# Patient Record
Sex: Female | Born: 1940 | Race: White | Hispanic: No | Marital: Married | Smoking: Current every day smoker
Health system: Southern US, Community
[De-identification: ages and names within clinical notes are randomized; demographics above are authoritative.]

## PROBLEM LIST (undated history)

## (undated) DIAGNOSIS — I639 Cerebral infarction, unspecified: Secondary | ICD-10-CM

## (undated) DIAGNOSIS — M1990 Unspecified osteoarthritis, unspecified site: Secondary | ICD-10-CM

## (undated) DIAGNOSIS — F32A Depression, unspecified: Secondary | ICD-10-CM

## (undated) DIAGNOSIS — I10 Essential (primary) hypertension: Secondary | ICD-10-CM

## (undated) DIAGNOSIS — G459 Transient cerebral ischemic attack, unspecified: Secondary | ICD-10-CM

## (undated) DIAGNOSIS — F319 Bipolar disorder, unspecified: Secondary | ICD-10-CM

## (undated) DIAGNOSIS — I2510 Atherosclerotic heart disease of native coronary artery without angina pectoris: Secondary | ICD-10-CM

## (undated) DIAGNOSIS — I219 Acute myocardial infarction, unspecified: Secondary | ICD-10-CM

## (undated) DIAGNOSIS — M545 Low back pain, unspecified: Secondary | ICD-10-CM

## (undated) DIAGNOSIS — E785 Hyperlipidemia, unspecified: Secondary | ICD-10-CM

## (undated) DIAGNOSIS — N281 Cyst of kidney, acquired: Secondary | ICD-10-CM

## (undated) DIAGNOSIS — F29 Unspecified psychosis not due to a substance or known physiological condition: Secondary | ICD-10-CM

## (undated) DIAGNOSIS — R0602 Shortness of breath: Secondary | ICD-10-CM

## (undated) DIAGNOSIS — F329 Major depressive disorder, single episode, unspecified: Secondary | ICD-10-CM

## (undated) HISTORY — PX: BLEPHAROPLASTY: SUR158

## (undated) HISTORY — PX: CORONARY ARTERY BYPASS GRAFT: SHX141

## (undated) HISTORY — PX: SUBCLAVIAN STENT PLACEMENT: SUR1038

## (undated) HISTORY — PX: OTHER SURGICAL HISTORY: SHX169

## (undated) HISTORY — PX: ABDOMINAL HYSTERECTOMY: SHX81

## (undated) HISTORY — PX: CARDIAC SURGERY: SHX584

---

## 2004-10-28 ENCOUNTER — Ambulatory Visit: Payer: Self-pay | Admitting: Internal Medicine

## 2006-03-03 ENCOUNTER — Other Ambulatory Visit: Payer: Self-pay

## 2006-03-03 ENCOUNTER — Ambulatory Visit: Payer: Self-pay | Admitting: Internal Medicine

## 2006-03-21 ENCOUNTER — Inpatient Hospital Stay (HOSPITAL_COMMUNITY): Admission: EM | Admit: 2006-03-21 | Discharge: 2006-03-22 | Payer: Self-pay | Admitting: Emergency Medicine

## 2006-05-24 ENCOUNTER — Encounter: Payer: Self-pay | Admitting: Internal Medicine

## 2006-06-11 ENCOUNTER — Encounter: Payer: Self-pay | Admitting: Internal Medicine

## 2006-07-11 ENCOUNTER — Encounter: Payer: Self-pay | Admitting: Internal Medicine

## 2006-08-11 ENCOUNTER — Encounter: Payer: Self-pay | Admitting: Internal Medicine

## 2006-08-21 ENCOUNTER — Other Ambulatory Visit: Payer: Self-pay

## 2006-08-21 ENCOUNTER — Emergency Department: Payer: Self-pay | Admitting: Unknown Physician Specialty

## 2006-09-11 ENCOUNTER — Encounter: Payer: Self-pay | Admitting: Internal Medicine

## 2007-02-14 ENCOUNTER — Ambulatory Visit: Payer: Self-pay | Admitting: Internal Medicine

## 2007-03-06 ENCOUNTER — Ambulatory Visit: Payer: Self-pay | Admitting: Psychiatry

## 2008-02-25 ENCOUNTER — Inpatient Hospital Stay: Payer: Self-pay | Admitting: Internal Medicine

## 2011-08-26 ENCOUNTER — Inpatient Hospital Stay: Payer: Self-pay | Admitting: Internal Medicine

## 2011-08-26 LAB — URINALYSIS, COMPLETE
Bacteria: NONE SEEN
Bilirubin,UR: NEGATIVE
Ketone: NEGATIVE
Ph: 6 (ref 4.5–8.0)
RBC,UR: 2 /HPF (ref 0–5)
Squamous Epithelial: 3
WBC UR: <1 /HPF (ref 0–5)

## 2011-08-26 LAB — CBC
HCT: 46.2 % (ref 35.0–47.0)
HGB: 15.6 g/dL (ref 12.0–16.0)
MCH: 30.5 pg (ref 26.0–34.0)
MCHC: 33.9 g/dL (ref 32.0–36.0)
RDW: 15 % — ABNORMAL HIGH (ref 11.5–14.5)

## 2011-08-26 LAB — CK TOTAL AND CKMB (NOT AT ARMC): CK-MB: 0.9 ng/mL (ref 0.5–3.6)

## 2011-08-26 LAB — COMPREHENSIVE METABOLIC PANEL
Alkaline Phosphatase: 99 U/L (ref 50–136)
Calcium, Total: 9.1 mg/dL (ref 8.5–10.1)
Creatinine: 0.89 mg/dL (ref 0.60–1.30)
EGFR (African American): >60
EGFR (Non-African Amer.): >60
Glucose: 120 mg/dL — ABNORMAL HIGH (ref 65–99)
Osmolality: 283 (ref 275–301)
SGPT (ALT): 28 U/L (ref 12–78)
Sodium: 141 mmol/L (ref 136–145)

## 2011-08-26 LAB — PROTIME-INR: Prothrombin Time: 13.8 secs (ref 11.5–14.7)

## 2011-08-27 LAB — BASIC METABOLIC PANEL
Anion Gap: 10 (ref 7–16)
Calcium, Total: 8.7 mg/dL (ref 8.5–10.1)
Co2: 25 mmol/L (ref 21–32)
Creatinine: 0.74 mg/dL (ref 0.60–1.30)
EGFR (African American): >60
EGFR (Non-African Amer.): >60
Glucose: 94 mg/dL (ref 65–99)
Sodium: 143 mmol/L (ref 136–145)

## 2011-08-27 LAB — CBC WITH DIFFERENTIAL/PLATELET
Basophil #: 0.1 10*3/uL (ref 0.0–0.1)
Eosinophil #: 0.2 10*3/uL (ref 0.0–0.7)
HGB: 13.6 g/dL (ref 12.0–16.0)
Lymphocyte %: 36.5 %
MCHC: 33.2 g/dL (ref 32.0–36.0)
Monocyte %: 11 %
Neutrophil %: 48.9 %
Platelet: 154 10*3/uL (ref 150–440)
RDW: 15.1 % — ABNORMAL HIGH (ref 11.5–14.5)

## 2011-08-27 LAB — LIPID PANEL
Ldl Cholesterol, Calc: 80 mg/dL (ref 0–100)
VLDL Cholesterol, Calc: 29 mg/dL (ref 5–40)

## 2011-08-28 LAB — PROTIME-INR: Prothrombin Time: 13.1 secs (ref 11.5–14.7)

## 2011-10-06 ENCOUNTER — Ambulatory Visit: Payer: Self-pay | Admitting: Vascular Surgery

## 2011-10-06 LAB — CREATININE, SERUM
EGFR (African American): >60
EGFR (Non-African Amer.): >60

## 2013-10-06 DIAGNOSIS — I2510 Atherosclerotic heart disease of native coronary artery without angina pectoris: Secondary | ICD-10-CM | POA: Insufficient documentation

## 2013-10-06 DIAGNOSIS — I679 Cerebrovascular disease, unspecified: Secondary | ICD-10-CM | POA: Insufficient documentation

## 2013-10-06 DIAGNOSIS — J449 Chronic obstructive pulmonary disease, unspecified: Secondary | ICD-10-CM | POA: Insufficient documentation

## 2013-10-06 DIAGNOSIS — F324 Major depressive disorder, single episode, in partial remission: Secondary | ICD-10-CM | POA: Insufficient documentation

## 2013-10-10 ENCOUNTER — Emergency Department: Payer: Self-pay | Admitting: Emergency Medicine

## 2013-10-22 DIAGNOSIS — G458 Other transient cerebral ischemic attacks and related syndromes: Secondary | ICD-10-CM | POA: Insufficient documentation

## 2014-04-29 NOTE — H&P (Signed)
PATIENT NAME:  Victoria Mccarty, POLCYN MR#:  [id number] DATE OF BIRTH:  15-Oct-1940  DATE OF ADMISSION:  08/26/2011  REFERRING PHYSICIAN: Dr. Mindi Junker.  FAMILY PHYSICIAN: Dr. Einar Crow.   REASON FOR ADMISSION: Stuttering expressive aphasia associated with dizziness and headache worrisome for transient ischemic attack.   HISTORY OF PRESENT ILLNESS: The patient is a 74 year old female with a history of coronary artery disease status post coronary artery bypass graft five years ago as well as hyperlipidemia. She continues to smoke. She presents to the Emergency Room with a three day history of intermittent expressive aphasia associated with dizziness, headache, and difficulty reading. In the Emergency Room this morning, the patient's symptoms improved spontaneously. Initial head CT was negative. She was not hypertensive. She is now admitted for further evaluation.   PAST MEDICAL HISTORY:  1. Atherosclerotic cardiovascular disease status post coronary artery bypass grafting.  2. Benign hypertension.  3. Hyperlipidemia.  4. Chronic obstructive pulmonary disease/tobacco abuse.  5. History of depression.  6. Status post hysterectomy.   MEDICATIONS:  1. Seroquel 100 mg p.o. at bedtime.  2. Coreg 6.25 mg p.o. b.i.d.  3. Pravastatin 20 mg p.o. at bedtime.   ALLERGIES: No known drug allergies.   SOCIAL HISTORY: The patient smokes 1 pack per day. No history of alcohol abuse.   FAMILY HISTORY: Positive for coronary artery disease, hypertension, stroke, and hyperlipidemia.   REVIEW OF SYSTEMS: CONSTITUTIONAL: No fever or change in weight. EYES: Has had visual disturbance. No glaucoma. ENT: No tinnitus or hearing loss. No nasal discharge or bleeding. No difficulty swallowing. RESPIRATORY: No cough or wheezing. Denies hemoptysis. CARDIOVASCULAR: No chest pain or orthopnea. No palpitations or syncope. GASTROINTESTINAL: No nausea, vomiting, or diarrhea. No abdominal pain. No change in bowel habits. GU: No  dysuria or hematuria. No incontinence. ENDOCRINE: No polyuria or polydipsia. No heat or cold intolerance. HEMATOLOGIC: The patient denies anemia, easy bruising, or bleeding. LYMPHATIC: No swollen glands. MUSCULOSKELETAL: The patient denies pain in her neck, back, shoulders, knees, or hips. No gout. NEUROLOGIC: No numbness. Denies migraines. Denies seizures. No previous stroke. PSYCH: The patient denies anxiety, insomnia, or depression.   PHYSICAL EXAMINATION:  GENERAL: The patient is in no acute distress.   VITAL SIGNS: Vital signs are currently remarkable for blood pressure 104/80 with a heart rate of 73 and a respiratory rate of 18. She is afebrile.   HEENT: Normocephalic, atraumatic. Pupils equally round and reactive to light and accommodation. Extraocular movements are intact. Sclerae anicteric. Conjunctivae are clear. Oropharynx is clear.   NECK: Supple without jugular venous distention or bruits. No adenopathy or thyromegaly is noted.   LUNGS: Clear to auscultation and percussion without wheezes, rales, or rhonchi. No dullness.   CARDIAC: Regular rate and rhythm with normal S1 and S2. No significant rubs, murmurs, or gallops. PMI is nondisplaced. Chest wall is nontender.   ABDOMEN: Soft, nontender, with normoactive bowel sounds. No organomegaly or masses were appreciated. No hernias or bruits were noted.   EXTREMITIES: Without clubbing, cyanosis, or edema. Pulses were 2+ bilaterally.   SKIN: Warm and dry without rash or lesions.   NEUROLOGIC: Cranial nerves II through XII grossly intact. Deep tendon reflexes were symmetric. Motor and sensory examination is nonfocal.   PSYCH: The patient was alert and oriented to person, place, and time. She was cooperative and used good judgment.   LABORATORY, RADIOLOGICAL AND DIAGNOSTIC DATA: Glucose was 120 with a BUN of 13 and a creatinine of 0.89 with a sodium of 141. White count was  8.6 with a hemoglobin of 15.6. Head CT revealed mild atrophy with  chronic small vessel ischemic changes.   ASSESSMENT:  1. Intermittent expressive aphasia with other associated symptoms concerning for recurrent transient ischemic attacks.  2. Atherosclerotic cardiovascular disease.  3. Chronic obstructive pulmonary disease/tobacco abuse.  4. Benign hypertension.  5. Hyperlipidemia.  6. Hyperglycemia.   PLAN:  1. The patient will be admitted to the floor with Lovenox and Plavix.  2. We will monitor her blood pressure closely and check on her lipid status.  3. Will obtain MRI of the brain as well as carotid Doppler's and an echocardiogram because of her transient ischemic attack like symptoms.  4. We will use a nicotine patch in the hospital and encourage the patient to stop smoking.  5. We will follow her sugars with Accu-Cheks before meals and at bedtime and add sliding scale insulin if needed.  6. Neuro checks q.4 hours.  7. Speech therapy consult.  8. Further treatment and evaluation will depend upon the patient's progress.   TOTAL TIME SPENT ON THIS PATIENT: 45 minutes.  ____________________________ Duane LopeJeffrey D. Judithann SheenSparks, MD jds:ap D: 08/26/2011 14:08:53 ET              T: 08/26/2011 14:27:35 ET JOB#: [id number] cc: Duane LopeJeffrey D. Judithann SheenSparks, MD, <Dictator> Marya AmslerMarshall W. Dareen PianoAnderson, MD Armstead Heiland Rodena Medin Phenix Grein MD ELECTRONICALLY SIGNED 08/26/2011 16:49

## 2014-04-29 NOTE — Op Note (Signed)
PATIENT NAME:  Victoria Mccarty, Victoria Mccarty MR#:  [id number] DATE OF BIRTH:  May 24, 1940  DATE OF PROCEDURE:  10/06/2011  PREOPERATIVE DIAGNOSES:  1. Left subclavian artery stenosis.  2. 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  3. Coronary disease.  4. Hypertension.   POSTOPERATIVE DIAGNOSES: 1. Left subclavian artery stenosis.  2. 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  3. Coronary disease.  4. Hypertension.   PROCEDURES:  1. Ultrasound guidance for vascular access, right femoral artery.  2. Catheter placement into left brachial artery from right femoral approach.  3. Thoracic aortogram and selective left subclavian and upper extremity angiogram.  4. Percutaneous transluminal angioplasty of proximal left subclavian artery stenosis with 7 mm diameter angioplasty balloon.  5. 7 mm diameter x 18 mm length balloon expandable stent placement for greater than 50% residual stenosis after angioplasty of the left subclavian artery.  6. StarClose closure device, right femoral artery.   SURGEON: Annice NeedyJason S. Dew, MD   ANESTHESIA: Local with moderate conscious sedation.   ESTIMATED BLOOD LOSS: Approximately 25 mL.  FLUOROSCOPY TIME: Approximately 6.8 minutes.   CONTRAST USED: 70 mL.    INDICATION FOR PROCEDURE: This is a 74 year old female who was referred to me from her neurologist's office after a posterior circulation stroke. She had a left subclavian stenosis on that side and it was felt that this was likely a causative factor. She also has a history of coronary disease with coronary bypass grafting. It is unclear whether or not she had a LIMA graft based off this left subclavian or if this was done as a free graft. Due to the neurologic symptoms, angiography with possible revascularization is indicated. Risks and benefits were discussed. Informed consent was obtained.   DESCRIPTION OF PROCEDURE: The patient is brought to the Vascular Interventional Radiology Suite. Groins were  shaved and prepped and a sterile surgical field was created. The right femoral head was localized with fluoroscopy and the right femoral artery was accessed without difficulty under direct ultrasound guidance with a Seldinger needle and permanent image was recorded. A 5 French sheath was placed. The patient was systemically heparinized with a total of 4500 units of intravenous heparin. Initially a pigtail catheter was placed in the ascending aorta and an LAO projection thoracic aortogram was performed. This showed a bovine configuration of the innominate and left common carotid artery off what appeared to be a common trunk. The left subclavian artery was initially not particularly well seen and the flow was much more sluggish through the left subclavian artery than through the other vessels. A second image was performed that did show the origin and there appeared to be a significant stenosis just beyond the origin. A JB-2 catheter was used to selectively cannulate this origin and imaging was performed. This demonstrated what appeared to be a stenosis in the 80 to 85% range 1 cm beyond the origin left subclavian terminating about 5 mm shy of the vertebral artery. I did not see an internal mammary artery coming off of the left subclavian or axillary locations. I was able to cross the lesion with a stiff angled Glidewire with a mild amount of difficulty and advanced the catheter beyond the lesion. Once I did this, I exchanged for a stiff wire and put a 6 [place]French shuttle sheath into the left subclavian artery. I had taken a catheter out to the brachial artery to exchange for the stiff wire. A 7 mm diameter balloon was then inflated in the proximal subclavian artery encompassing the  lesion. A waste was taken. Completion angiogram following angioplasty showed a significant residual stenosis of greater than 50% after angioplasty and for this reason I elected to place a stent. We did not have any 0.035 stents, only long  working shaft to work through the shuttle sheath, so I again placed a catheter out into the brachial artery and exchanged for a V-18 wire removing the diagnostic catheter. Over the 0.018 wire we used a Herculink 7 mm diameter x 18 mm length balloon expandable stent, deployed this precisely encompassing the lesion terminating just before the vertebral takeoff and originating into the subclavian artery not sticking into the aorta. The lesion was well taken. We took this to burst inflation which was approximately 7.2 mm in diameter and completion angiogram following this showed excellent flow through the stent with no significant residual stenosis. Antegrade flow in the left vertebral artery was now present. I still did not see any form of IMA bypass coming off of the left side so I would assume that this was either done as a free LIMA graft or as a right mammary graft to her coronary or that this has been chronically occluded due to the low flow. At this point I elected to terminate the procedure. The sheath was pulled back to the ipsilateral external iliac artery and oblique arteriogram was performed. StarClose closure device was deployed in the usual fashion with excellent hemostatic result. The patient tolerated the procedure well and was taken to the recovery room in stable condition.   ____________________________ Annice Needy, MD jsd:drc D: 10/06/2011 11:03:28 ET T: 10/06/2011 11:52:48 ET JOB#: [id number]  cc: Annice Needy, MD, <Dictator> Marya Amsler. Dareen Piano, MD Paulita Fujita Malvin Johns, MD Annice Needy MD ELECTRONICALLY SIGNED 10/06/2011 13:45

## 2014-04-29 NOTE — Discharge Summary (Signed)
PATIENT NAME:  Victoria Mccarty, Victoria Mccarty MR#:  [id number] DATE OF BIRTH:  11/09/1940  DATE OF ADMISSION:  08/26/2011 DATE OF DISCHARGE:  08/29/2011  DISCHARGE DIAGNOSES:  1. Stroke, multiple.  2. Hypertension, accelerated with the stroke.  3. Cardiomyopathy with ejection fraction 35%.  4. Possible subclavian stenosis.  5. Long history of tobacco abuse now trying to discontinue.  6. Hypercholesterolemia, on pravastatin.  7. History of depression, doing well on Seroquel as an outpatient.   DISCHARGE MEDICATIONS:  1. Carvedilol 12.5 mg twice a day. 2. Losartan 100 mg daily.  3. Xarelto 20 mg daily for at least a month. Further time frame to be decided on follow-up with neurology. 4. Seroquel XR 300 mg nightly as she had been doing previously as an outpatient.   HISTORY AND PHYSICAL: Please see detailed history and physical done on admission.   HOSPITAL COURSE: The patient was admitted with expressive aphasia and found to have two left-sided strokes, in the corona radiata and the left parietal lobe. Her aphasia improved, or is partial at this point, speaking relatively well morning, although it does change in intensity over time. Echocardiogram showed moderate global hypokinesis with an ejection fraction 30% on echocardiogram. She had no carotid stenosis on the carotid ultrasound, but did have bidirectional flow in the left vertebral artery which could mean she had a possible stenotic lesion in the proximal left subclavian. Her labs were otherwise unremarkable with normal CBC, MET-B throughout with some intermittent hyperglycemia that was mild and may not reflect true fasting levels despite that. Her LDL was 80 on the pravastatin, which is appropriate. She was somewhat motivated to stop smoking and had a nicotine patch here, although it is unclear if that will last as an outpatient. She has an electronic cigarette which she will use going forward, per  her plan. We will try to arrange early follow-up with  neurology to help decide Xarelto dosing and length of time as well as any further work-up needed for the subclavian stenosis. We will see her in my office as well to judge her blood pressure further away from the stroke. We will try to get speech therapy outpatient evaluation and help and follow-up as well. ____________________________ Ocie Cornfield. Ouida Sills, MD mwa:slb D: 08/29/2011 07:43:16 ET T: 08/29/2011 12:45:20 ET JOB#: [id number]  cc: Ocie Cornfield. Ouida Sills, MD, <Dictator> Kirk Ruths MD ELECTRONICALLY SIGNED 08/30/2011 7:34

## 2014-08-06 ENCOUNTER — Inpatient Hospital Stay
Admit: 2014-08-06 | Discharge: 2014-08-06 | Disposition: A | Discharge status: Home / Self Care | Payer: Medicare Other | Attending: Internal Medicine | Admitting: Internal Medicine

## 2014-08-06 ENCOUNTER — Encounter: Payer: Self-pay | Admitting: Emergency Medicine

## 2014-08-06 ENCOUNTER — Other Ambulatory Visit: Payer: Self-pay | Admitting: Physician Assistant

## 2014-08-06 ENCOUNTER — Emergency Department: Payer: Medicare Other

## 2014-08-06 ENCOUNTER — Inpatient Hospital Stay
Admission: EM | Admit: 2014-08-06 | Discharge: 2014-08-08 | DRG: 066 | Disposition: A | Discharge status: Home / Self Care | Payer: Medicare Other | Attending: Internal Medicine | Admitting: Internal Medicine

## 2014-08-06 DIAGNOSIS — Z8673 Personal history of transient ischemic attack (TIA), and cerebral infarction without residual deficits: Secondary | ICD-10-CM

## 2014-08-06 DIAGNOSIS — N281 Cyst of kidney, acquired: Secondary | ICD-10-CM | POA: Diagnosis present

## 2014-08-06 DIAGNOSIS — Z7902 Long term (current) use of antithrombotics/antiplatelets: Secondary | ICD-10-CM | POA: Diagnosis not present

## 2014-08-06 DIAGNOSIS — Z823 Family history of stroke: Secondary | ICD-10-CM

## 2014-08-06 DIAGNOSIS — F329 Major depressive disorder, single episode, unspecified: Secondary | ICD-10-CM | POA: Diagnosis present

## 2014-08-06 DIAGNOSIS — Z8249 Family history of ischemic heart disease and other diseases of the circulatory system: Secondary | ICD-10-CM | POA: Diagnosis not present

## 2014-08-06 DIAGNOSIS — R1011 Right upper quadrant pain: Secondary | ICD-10-CM | POA: Diagnosis present

## 2014-08-06 DIAGNOSIS — I10 Essential (primary) hypertension: Secondary | ICD-10-CM | POA: Diagnosis present

## 2014-08-06 DIAGNOSIS — R29898 Other symptoms and signs involving the musculoskeletal system: Secondary | ICD-10-CM

## 2014-08-06 DIAGNOSIS — R0781 Pleurodynia: Secondary | ICD-10-CM

## 2014-08-06 DIAGNOSIS — E785 Hyperlipidemia, unspecified: Secondary | ICD-10-CM | POA: Diagnosis present

## 2014-08-06 DIAGNOSIS — I2510 Atherosclerotic heart disease of native coronary artery without angina pectoris: Secondary | ICD-10-CM | POA: Diagnosis present

## 2014-08-06 DIAGNOSIS — R109 Unspecified abdominal pain: Secondary | ICD-10-CM

## 2014-08-06 DIAGNOSIS — M1990 Unspecified osteoarthritis, unspecified site: Secondary | ICD-10-CM | POA: Diagnosis present

## 2014-08-06 DIAGNOSIS — I714 Abdominal aortic aneurysm, without rupture: Secondary | ICD-10-CM | POA: Diagnosis present

## 2014-08-06 DIAGNOSIS — F17210 Nicotine dependence, cigarettes, uncomplicated: Secondary | ICD-10-CM | POA: Diagnosis present

## 2014-08-06 DIAGNOSIS — I6350 Cerebral infarction due to unspecified occlusion or stenosis of unspecified cerebral artery: Secondary | ICD-10-CM | POA: Insufficient documentation

## 2014-08-06 DIAGNOSIS — I639 Cerebral infarction, unspecified: Secondary | ICD-10-CM | POA: Diagnosis not present

## 2014-08-06 HISTORY — DX: Major depressive disorder, single episode, unspecified: F32.9

## 2014-08-06 HISTORY — DX: Depression, unspecified: F32.A

## 2014-08-06 HISTORY — DX: Low back pain: M54.5

## 2014-08-06 HISTORY — DX: Atherosclerotic heart disease of native coronary artery without angina pectoris: I25.10

## 2014-08-06 HISTORY — DX: Hyperlipidemia, unspecified: E78.5

## 2014-08-06 HISTORY — DX: Unspecified osteoarthritis, unspecified site: M19.90

## 2014-08-06 HISTORY — DX: Cerebral infarction, unspecified: I63.9

## 2014-08-06 HISTORY — DX: Low back pain, unspecified: M54.50

## 2014-08-06 HISTORY — DX: Essential (primary) hypertension: I10

## 2014-08-06 LAB — DIFFERENTIAL
BASOS ABS: 0.1 10*3/uL (ref 0–0.1)
BASOS PCT: 1 %
Eosinophils Absolute: 0.2 10*3/uL (ref 0–0.7)
Eosinophils Relative: 2 %
Lymphocytes Relative: 14 %
Lymphs Abs: 1.5 10*3/uL (ref 1.0–3.6)
MONO ABS: 1.1 10*3/uL — AB (ref 0.2–0.9)
MONOS PCT: 11 %
NEUTROS PCT: 72 %
Neutro Abs: 7.9 10*3/uL — ABNORMAL HIGH (ref 1.4–6.5)

## 2014-08-06 LAB — CBC
HEMATOCRIT: 39.9 % (ref 35.0–47.0)
Hemoglobin: 13.6 g/dL (ref 12.0–16.0)
MCH: 31.7 pg (ref 26.0–34.0)
MCHC: 34.1 g/dL (ref 32.0–36.0)
MCV: 92.9 fL (ref 80.0–100.0)
PLATELETS: 141 10*3/uL — AB (ref 150–440)
RBC: 4.3 MIL/uL (ref 3.80–5.20)
RDW: 13.8 % (ref 11.5–14.5)
WBC: 10.8 10*3/uL (ref 3.6–11.0)

## 2014-08-06 LAB — COMPREHENSIVE METABOLIC PANEL
ALBUMIN: 3.2 g/dL — AB (ref 3.5–5.0)
ALK PHOS: 58 U/L (ref 38–126)
ALT: 23 U/L (ref 14–54)
AST: 26 U/L (ref 15–41)
Anion gap: 9 (ref 5–15)
BILIRUBIN TOTAL: 0.6 mg/dL (ref 0.3–1.2)
BUN: 17 mg/dL (ref 6–20)
CALCIUM: 8.9 mg/dL (ref 8.9–10.3)
CHLORIDE: 103 mmol/L (ref 101–111)
CO2: 26 mmol/L (ref 22–32)
Creatinine, Ser: 1 mg/dL (ref 0.44–1.00)
GFR calc Af Amer: >60 mL/min (ref >60)
GFR, EST NON AFRICAN AMERICAN: 55 mL/min — AB (ref >60)
GLUCOSE: 169 mg/dL — AB (ref 65–99)
Potassium: 3.9 mmol/L (ref 3.5–5.1)
Sodium: 138 mmol/L (ref 135–145)
TOTAL PROTEIN: 6.9 g/dL (ref 6.5–8.1)

## 2014-08-06 LAB — TSH: TSH: 1.717 u[IU]/mL (ref 0.350–4.500)

## 2014-08-06 LAB — TROPONIN I: Troponin I: <0.03 ng/mL (ref <0.031)

## 2014-08-06 LAB — PROTIME-INR
INR: 1
PROTHROMBIN TIME: 13.4 s (ref 11.4–15.0)

## 2014-08-06 LAB — APTT: APTT: 28 s (ref 24–36)

## 2014-08-06 MED ORDER — VITAMIN D 1000 UNITS PO TABS
1000.0000 [IU] | ORAL_TABLET | Freq: Every day | ORAL | Status: DC
  Administered 2014-08-07 – 2014-08-08 (×2): 1000 [IU] via ORAL
  Filled 2014-08-06 (×2): qty 1

## 2014-08-06 MED ORDER — SODIUM CHLORIDE 0.9 % IJ SOLN: 3.0000 mL | INTRAMUSCULAR | Status: DC | PRN

## 2014-08-06 MED ORDER — ACETAMINOPHEN 650 MG RE SUPP
650.0000 mg | Freq: Four times a day (QID) | RECTAL | Status: DC | PRN
Start: 2014-08-06 — End: 2014-08-08

## 2014-08-06 MED ORDER — IBUPROFEN 600 MG PO TABS: 600.0000 mg | ORAL_TABLET | Freq: Four times a day (QID) | ORAL | Status: DC | PRN

## 2014-08-06 MED ORDER — CLOPIDOGREL BISULFATE 75 MG PO TABS
75.0000 mg | ORAL_TABLET | Freq: Every day | ORAL | Status: DC
  Administered 2014-08-07 – 2014-08-08 (×2): 75 mg via ORAL
  Filled 2014-08-06 (×2): qty 1

## 2014-08-06 MED ORDER — ENOXAPARIN SODIUM 40 MG/0.4ML ~~LOC~~ SOLN
40.0000 mg | SUBCUTANEOUS | Status: DC
  Administered 2014-08-06 – 2014-08-07 (×2): 40 mg via SUBCUTANEOUS
  Filled 2014-08-06 (×2): qty 0.4

## 2014-08-06 MED ORDER — ADULT MULTIVITAMIN W/MINERALS CH
ORAL_TABLET | Freq: Every day | ORAL | Status: DC
  Administered 2014-08-07 – 2014-08-08 (×2): 1 via ORAL
  Filled 2014-08-06 (×2): qty 1

## 2014-08-06 MED ORDER — ACETAMINOPHEN 325 MG PO TABS: 650.0000 mg | ORAL_TABLET | Freq: Four times a day (QID) | ORAL | Status: DC | PRN

## 2014-08-06 MED ORDER — FOLIC ACID 1 MG PO TABS
1.0000 mg | ORAL_TABLET | Freq: Every day | ORAL | Status: DC
  Administered 2014-08-07 – 2014-08-08 (×2): 1 mg via ORAL
  Filled 2014-08-06 (×2): qty 1

## 2014-08-06 MED ORDER — QUETIAPINE FUMARATE ER 300 MG PO TB24
300.0000 mg | ORAL_TABLET | Freq: Every day | ORAL | Status: DC
  Administered 2014-08-06 – 2014-08-07 (×2): 300 mg via ORAL
  Filled 2014-08-06 (×3): qty 1

## 2014-08-06 MED ORDER — ONDANSETRON HCL 4 MG PO TABS: 4.0000 mg | ORAL_TABLET | Freq: Four times a day (QID) | ORAL | Status: DC | PRN

## 2014-08-06 MED ORDER — ONDANSETRON HCL 4 MG/2ML IJ SOLN: 4.0000 mg | Freq: Four times a day (QID) | INTRAMUSCULAR | Status: DC | PRN

## 2014-08-06 MED ORDER — ASPIRIN EC 81 MG PO TBEC
81.0000 mg | DELAYED_RELEASE_TABLET | Freq: Every day | ORAL | Status: DC
  Administered 2014-08-06 – 2014-08-08 (×3): 81 mg via ORAL
  Filled 2014-08-06 (×3): qty 1

## 2014-08-06 MED ORDER — LOSARTAN POTASSIUM 50 MG PO TABS
100.0000 mg | ORAL_TABLET | Freq: Every day | ORAL | Status: DC
  Administered 2014-08-06 – 2014-08-08 (×3): 100 mg via ORAL
  Filled 2014-08-06 (×3): qty 2

## 2014-08-06 MED ORDER — ATORVASTATIN CALCIUM 20 MG PO TABS
20.0000 mg | ORAL_TABLET | Freq: Every day | ORAL | Status: DC
  Administered 2014-08-07 – 2014-08-08 (×2): 20 mg via ORAL
  Filled 2014-08-06 (×2): qty 1

## 2014-08-06 MED ORDER — QUETIAPINE FUMARATE 100 MG PO TABS
100.0000 mg | ORAL_TABLET | Freq: Every day | ORAL | Status: DC
  Administered 2014-08-07 – 2014-08-08 (×2): 100 mg via ORAL
  Filled 2014-08-06 (×3): qty 1

## 2014-08-06 MED ORDER — DIAZEPAM 5 MG/ML IJ SOLN
2.5000 mg | Freq: Once | INTRAMUSCULAR | Status: AC
  Administered 2014-08-07: 08:00:00 2.5 mg via INTRAVENOUS
  Filled 2014-08-06: qty 2

## 2014-08-06 MED ORDER — SODIUM CHLORIDE 0.9 % IJ SOLN
3.0000 mL | Freq: Two times a day (BID) | INTRAMUSCULAR | Status: DC
Start: 2014-08-06 — End: 2014-08-08
  Administered 2014-08-07 – 2014-08-08 (×3): 3 mL via INTRAVENOUS

## 2014-08-06 MED ORDER — SODIUM CHLORIDE 0.9 % IV SOLN: 250.0000 mL | INTRAVENOUS | Status: DC | PRN

## 2014-08-06 NOTE — H&P (Signed)
[facility] [place] at [facility]   PATIENT NAME: Victoria Mccarty    MR#:  [id number]  DATE OF BIRTH:  12/05/1940  DATE OF ADMISSION:  08/06/2014  PRIMARY CARE PHYSICIAN: Lauro Regulus., MD   REQUESTING/REFERRING PHYSICIAN: Phineas Semen  CHIEF COMPLAINT:   Chief Complaint  Patient presents with  . Weakness    HISTORY OF PRESENT ILLNESS: Victoria Mccarty  is a 74 y.o. female with a known history of previous CVA, coronary artery disease presents to the emergency department today complaining of left arm feeling heavy. Started around 5 PM. Patient was actually coming here to pick up contrast for the CT scan that she was scheduled to have. Her symptoms persisted therefore she came to the ED. Patient had a CT scan of the head which was negative for acute abnormality. She states that the strength is good but still feels like the hand and arm is heavy. She otherwise also complains of her left leg feeling weak. She otherwise denies any chest pain shortness of breath or palpitations  PAST MEDICAL HISTORY:   Past Medical History  Diagnosis Date  . Coronary artery disease   . Hypertension   . Stroke   . Depression   . Osteoarthritis   . Low back pain   . Hyperlipemia     PAST SURGICAL HISTORY:  Past Surgical History  Procedure Laterality Date  . Cardiac surgery    . Abdominal hysterectomy    . Subclavian stent placement    . Tumor resection kidney      SOCIAL HISTORY:  History  Substance Use Topics  . Smoking status: Current Every Day Smoker  . Smokeless tobacco: Not on file  . Alcohol Use: 1.2 oz/week    2 Standard drinks or equivalent per week    FAMILY HISTORY:  Family History  Problem Relation Age of Onset  . CVA    . CAD      DRUG ALLERGIES: No Known Allergies  REVIEW OF SYSTEMS:   CONSTITUTIONAL: No fever, fatigue or weakness.  EYES: No blurred or double vision.  EARS, NOSE, AND THROAT: No tinnitus or ear pain.  RESPIRATORY: No  cough, shortness of breath, wheezing or hemoptysis.  CARDIOVASCULAR: No chest pain, orthopnea, edema.  GASTROINTESTINAL: No nausea, vomiting, diarrhea or positive abdominal pain.  GENITOURINARY: No dysuria, hematuria.  ENDOCRINE: No polyuria, nocturia,  HEMATOLOGY: No anemia, easy bruising or bleeding SKIN: No rash or lesion. MUSCULOSKELETAL: No joint pain or arthritis.   NEUROLOGIC: No tingling, left arm and leg heaviness.  PSYCHIATRY: No anxiety or depression.   MEDICATIONS AT HOME:  Prior to Admission medications   Medication Sig Start Date End Date Taking? Authorizing Provider  atorvastatin (LIPITOR) 20 MG tablet Take 20 mg by mouth daily.   Yes Historical Provider, MD  B Complex Vitamins (VITAMIN B COMPLEX PO) Take 1 tablet by mouth daily.   Yes Historical Provider, MD  carvedilol (COREG) 25 MG tablet Take 25 mg by mouth 2 (two) times daily.   Yes Historical Provider, MD  cholecalciferol (VITAMIN D) 1000 UNITS tablet Take 1,000 Units by mouth daily.   Yes Historical Provider, MD  clopidogrel (PLAVIX) 75 MG tablet Take 75 mg by mouth daily.   Yes Historical Provider, MD  folic acid (FOLVITE) 800 MCG tablet Take 800 mcg by mouth daily.   Yes Historical Provider, MD  ibuprofen (ADVIL,MOTRIN) 200 MG tablet Take 600 mg by mouth every 6 (six) hours as needed for mild pain.  Yes Historical Provider, MD  losartan (COZAAR) 100 MG tablet Take 100 mg by mouth daily.   Yes Historical Provider, MD  QUEtiapine (SEROQUEL XR) 300 MG 24 hr tablet Take 300 mg by mouth at bedtime.   Yes Historical Provider, MD  QUEtiapine (SEROQUEL) 100 MG tablet Take 100 mg by mouth daily.   Yes Historical Provider, MD      PHYSICAL EXAMINATION:   VITAL SIGNS: Blood pressure 127/83, pulse 66, temperature 98 F (36.7 C), temperature source Oral, resp. rate 18, height 5\' 8"  (1.727 m), weight 70.761 kg (156 lb), SpO2 96 %.  GENERAL:  74 y.o.-year-old patient lying in the bed with no acute distress.  EYES: Pupils  equal, round, reactive to light and accommodation. No scleral icterus. Extraocular muscles intact.  HEENT: Head atraumatic, normocephalic. Oropharynx and nasopharynx clear.  NECK:  Supple, no jugular venous distention. No thyroid enlargement, no tenderness.  LUNGS: Normal breath sounds bilaterally, no wheezing, rales,rhonchi or crepitation. No use of accessory muscles of respiration.  CARDIOVASCULAR: S1, S2 normal. No murmurs, rubs, or gallops.  ABDOMEN: Soft, nontender, nondistended. Bowel sounds present. No organomegaly or mass.  EXTREMITIES: No pedal edema, cyanosis, or clubbing.  NEUROLOGIC: Cranial nerves II through XII are intact. Muscle strength 5/5 in all extremities. Sensation intact. Gait not checked.  PSYCHIATRIC: The patient is alert and oriented x 3.  SKIN: No obvious rash, lesion, or ulcer.   LABORATORY PANEL:   CBC  Recent Labs Lab 08/06/14 1825  WBC 10.8  HGB 13.6  HCT 39.9  PLT 141*  MCV 92.9  MCH 31.7  MCHC 34.1  RDW 13.8  LYMPHSABS 1.5  MONOABS 1.1*  EOSABS 0.2  BASOSABS 0.1   ------------------------------------------------------------------------------------------------------------------  Chemistries   Recent Labs Lab 08/06/14 1825  NA 138  K 3.9  CL 103  CO2 26  GLUCOSE 169*  BUN 17  CREATININE 1.00  CALCIUM 8.9  AST 26  ALT 23  ALKPHOS 58  BILITOT 0.6   ------------------------------------------------------------------------------------------------------------------ estimated creatinine clearance is 50.5 mL/min (by C-G formula based on Cr of 1). ------------------------------------------------------------------------------------------------------------------ No results for input(s): TSH, T4TOTAL, T3FREE, THYROIDAB in the last 72 hours.  Invalid input(s): FREET3   Coagulation profile  Recent Labs Lab 08/06/14 1825  INR 1.00    ------------------------------------------------------------------------------------------------------------------- No results for input(s): DDIMER in the last 72 hours. -------------------------------------------------------------------------------------------------------------------  Cardiac Enzymes  Recent Labs Lab 08/06/14 1825  TROPONINI <0.03   ------------------------------------------------------------------------------------------------------------------ Invalid input(s): POCBNP  ---------------------------------------------------------------------------------------------------------------  Urinalysis    Component Value Date/Time   COLORURINE Yellow 08/26/2011 1413   APPEARANCEUR Hazy 08/26/2011 1413   LABSPEC 1.012 08/26/2011 1413   PHURINE 6.0 08/26/2011 1413   GLUCOSEU Negative 08/26/2011 1413   HGBUR Negative 08/26/2011 1413   BILIRUBINUR Negative 08/26/2011 1413   KETONESUR Negative 08/26/2011 1413   PROTEINUR 30 mg/dL 16/10/9602 5409   NITRITE Negative 08/26/2011 1413   LEUKOCYTESUR Negative 08/26/2011 1413     RADIOLOGY: Ct Head Wo Contrast  08/06/2014   CLINICAL DATA:  Acute onset left upper extremity weakness  EXAM: CT HEAD WITHOUT CONTRAST  TECHNIQUE: Contiguous axial images were obtained from the base of the skull through the vertex without intravenous contrast.  COMPARISON:  August 26, 2011 head CT and brain MRI August 27, 2011  FINDINGS: There is age related volume loss. There is no intracranial mass, hemorrhage, extra-axial fluid collection, midline shift. There is evidence of a prior small lacunar infarct in the mid left centrum semiovale. There is slight small vessel disease in the centra  semiovale bilaterally. No acute infarct is evident on this study. Bony calvarium appears intact. The mastoid air cells are clear.  IMPRESSION: Prior small lacunar infarct mid left centrum semiovale. Mild periventricular small vessel disease. No acute infarct evident.  No hemorrhage or mass effect.   Electronically Signed   By: Bretta Bang III M.D.   On: 08/06/2014 18:47    EKG: Orders placed or performed during the hospital encounter of 08/06/14  . ED EKG  . ED EKG    IMPRESSION AND PLAN:  Patient is a 74 year old white female presents with left arm and left leg heaviness symptoms continue to persist  1. CVA- Will admit patient's to the floor with telemetry., Obtain MRI of the brain tomorrow. Carotid Dopplers and echocardiogram of the heart. She is artery on Plavix and I will add aspirin to her current regimen.  2. Coronary artery disease continue Coreg and Plavix  3. Hyperlipidemia continue Lipitor  4. Hypertension continue losartan  5. Depression continue Seroquel  All the records are reviewed and case discussed with ED provider. Management plans discussed with the patient, family and they are in agreement.  CODE STATUS: Full    TOTAL TIME TAKING CARE OF THIS PATIENT: 55 minutes.    Auburn Bilberry M.D on 08/06/2014 at 8:18 PM  Between 7am to 6pm - Pager - [phone]  After 6pm go to [email or web address] - password [email or web address] [facility]  [facility]  Office  [phone]  CC: Primary care physician; Lauro Regulus., MD

## 2014-08-06 NOTE — ED Provider Notes (Signed)
[facility] Emergency Department Provider Note   ____________________________________________  Time seen: 88  I have reviewed the triage vital signs and the nursing notes.   HISTORY  Chief Complaint Weakness   History limited by: Not Limited   HPI Victoria Mccarty is a 74 y.o. female who presents to the emergency department today because of concerns for left arm numbness. Patient states that this started roughly 2 hours ago. She states initially she had feelings of pins and needles however it then progressed to be flowing that "the arm did not belong to me". She also experienced some weakness in the arm and left lower extremity. Furthermore she states she had a brief episode of left facial numbness. At the time of my exam the patient states that she has improved and that her arm was feeling a lot better. Patient does have a prior history of stroke however is not on any current anticoagulation. She has been seen recently for right abdominal pain and was planning on undergoing a CT of the abdomen pelvis tomorrow to further evaluate that pain.    Past Medical History  Diagnosis Date  . Coronary artery disease   . Hypertension   . Stroke     There are no active problems to display for this patient.   Past Surgical History  Procedure Laterality Date  . Cardiac surgery    . Abdominal hysterectomy      No current outpatient prescriptions on file.  Allergies Review of patient's allergies indicates no known allergies.  History reviewed. No pertinent family history.  Social History History  Substance Use Topics  . Smoking status: Current Every Day Smoker  . Smokeless tobacco: Not on file  . Alcohol Use: Yes    Review of Systems  Constitutional: Negative for fever. Cardiovascular: Negative for chest pain. Respiratory: Negative for shortness of breath. Gastrointestinal: Negative for abdominal pain, vomiting and diarrhea. Genitourinary: Negative  for dysuria. Musculoskeletal: Negative for back pain. Skin: Negative for rash. Neurological: Left arm numbness and weakness, left leg weakness, left facial numbness.   10-point ROS otherwise negative.  ____________________________________________   PHYSICAL EXAM:  VITAL SIGNS: ED Triage Vitals  Enc Vitals Group     BP 08/06/14 1818 138/48 mmHg     Pulse Rate 08/06/14 1818 67     Resp 08/06/14 1818 22     Temp 08/06/14 1818 98 F (36.7 C)     Temp Source 08/06/14 1818 Oral     SpO2 08/06/14 1818 95 %     Weight 08/06/14 1818 156 lb (70.761 kg)     Height 08/06/14 1818  (1.727 m)     Head Cir --      Peak Flow --      Pain Score 08/06/14 1819 4   Constitutional: Alert and oriented. Well appearing and in no distress. Eyes: Conjunctivae are normal. PERRL. Normal extraocular movements. ENT   Head: Normocephalic and atraumatic.   Nose: No congestion/rhinnorhea.   Mouth/Throat: Mucous membranes are moist.   Neck: No stridor. Hematological/Lymphatic/Immunilogical: No cervical lymphadenopathy. Cardiovascular: Normal rate, regular rhythm.  No murmurs, rubs, or gallops. Respiratory: Normal respiratory effort without tachypnea nor retractions. Breath sounds are clear and equal bilaterally. No wheezes/rales/rhonchi. Gastrointestinal: Soft and minimally tender on the right side. Genitourinary: Deferred Musculoskeletal: Normal range of motion in all extremities. No joint effusions.  No lower extremity tenderness nor edema. Neurologic:  Normal speech and language. CN II-XII intact, tongue midline, no facial assemmetry. Strength 5/5 in  upper and lower extremities, no drift. Sensation intact. Normal finger to nose. No gross focal neurologic deficits are appreciated. Speech is normal.  Skin:  Skin is warm, dry and intact. No rash noted. Psychiatric: Mood and affect are normal. Speech and behavior are normal. Patient exhibits appropriate insight and  judgment.  ____________________________________________    LABS (pertinent positives/negatives)  Labs Reviewed  CBC - Abnormal; Notable for the following:    Platelets 141 (*)    All other components within normal limits  DIFFERENTIAL - Abnormal; Notable for the following:    Neutro Abs 7.9 (*)    Monocytes Absolute 1.1 (*)    All other components within normal limits  COMPREHENSIVE METABOLIC PANEL - Abnormal; Notable for the following:    Glucose, Bld 169 (*)    Albumin 3.2 (*)    GFR calc non Af Amer 55 (*)    All other components within normal limits  PROTIME-INR  APTT  TROPONIN I  CBC  CREATININE, SERUM  TSH     ____________________________________________   EKG  I, Phineas Semen, attending physician, personally viewed and interpreted this EKG  EKG Time: 1831 Rate: 65 Rhythm: Normal sinus rhythm Axis: Normal Intervals: qtc 438 QRS: Narrow, incomplete right bundle branch block ST changes: No ST elevation  ____________________________________________    RADIOLOGY  CT head IMPRESSION: Prior small lacunar infarct mid left centrum semiovale. Mild periventricular small vessel disease. No acute infarct evident. No hemorrhage or mass effect. ____________________________________________   PROCEDURES  Procedure(s) performed: None  Critical Care performed: No  ____________________________________________   INITIAL IMPRESSION / ASSESSMENT AND PLAN / ED COURSE  Pertinent labs & imaging results that were available during my care of the patient were reviewed by me and considered in my medical decision making (see chart for details).  Patient presents to the emergency department today because of some left upper extremity weakness. At the time of my exam patient stated that the weakness was greatly improved. Given the rapid improvement of symptoms that do not feel patient is a candidate for TPA. We will however plan on admitting patient to the hospital for  TIA symptoms.  ____________________________________________   FINAL CLINICAL IMPRESSION(S) / ED DIAGNOSES  Final diagnoses:  Left arm weakness     Phineas Semen, MD 08/06/14 2140

## 2014-08-06 NOTE — ED Notes (Signed)
States she developed some left arm weakness about 1 1/2 hrs ago ..grip is weaker on left than right   No facial droop. And speech is clear

## 2014-08-07 ENCOUNTER — Ambulatory Visit: Admission: RE | Admit: 2014-08-07 | Payer: Medicare Other | Source: Ambulatory Visit

## 2014-08-07 ENCOUNTER — Inpatient Hospital Stay: Payer: Medicare Other

## 2014-08-07 LAB — URINALYSIS COMPLETE WITH MICROSCOPIC (ARMC ONLY)
BILIRUBIN URINE: NEGATIVE
Glucose, UA: NEGATIVE mg/dL
HGB URINE DIPSTICK: NEGATIVE
Ketones, ur: NEGATIVE mg/dL
Leukocytes, UA: NEGATIVE
NITRITE: NEGATIVE
PROTEIN: NEGATIVE mg/dL
Specific Gravity, Urine: 1.008 (ref 1.005–1.030)
pH: 6 (ref 5.0–8.0)

## 2014-08-07 MED ORDER — LORAZEPAM 2 MG/ML IJ SOLN
0.5000 mg | Freq: Once | INTRAMUSCULAR | Status: AC
  Administered 2014-08-07: 10:00:00 0.5 mg via INTRAVENOUS
  Filled 2014-08-07: qty 1

## 2014-08-07 MED ORDER — CARVEDILOL 25 MG PO TABS
25.0000 mg | ORAL_TABLET | Freq: Two times a day (BID) | ORAL | Status: DC
  Administered 2014-08-07 – 2014-08-08 (×2): 25 mg via ORAL
  Filled 2014-08-07 (×2): qty 1

## 2014-08-07 NOTE — Progress Notes (Signed)
Patient ID: Victoria Mccarty, female   DOB: 11-07-1940, 74 y.o.   MRN: [id number] [facility] PROGRESS NOTE  PCP: Lauro Regulus., MD  HPI/Subjective: Patient came in with left-sided weakness. This is now resolved completely. No discomfort in her neck. She has had previous stroke. MRI is negative for stroke. Dr. Dareen Piano was working her up for some abdominal back pain and actually ordered a CT scan for today. Patient has no diarrhea and no nausea or vomiting.  Objective: Filed Vitals:   08/07/14 1044  BP: 175/75  Pulse: 72  Temp:   Resp: 20    Filed Weights   08/06/14 1818 08/06/14 2120  Weight: 70.761 kg (156 lb) 70.625 kg (155 lb 11.2 oz)    ROS: Review of Systems  Constitutional: Negative for fever and chills.  Eyes: Negative for blurred vision.  Respiratory: Negative for cough and shortness of breath.   Cardiovascular: Negative for chest pain.  Gastrointestinal: Positive for abdominal pain. Negative for nausea, vomiting, diarrhea and constipation.  Genitourinary: Negative for dysuria.  Musculoskeletal: Positive for back pain. Negative for joint pain.  Neurological: Negative for dizziness and headaches.   Exam: Physical Exam  Constitutional: She is oriented to person, place, and time.  HENT:  Nose: No mucosal edema.  Mouth/Throat: No oropharyngeal exudate or posterior oropharyngeal edema.  Eyes: Conjunctivae, EOM and lids are normal. Pupils are equal, round, and reactive to light.  Neck: No JVD present. Carotid bruit is not present. No edema present. No thyroid mass and no thyromegaly present.  Cardiovascular: S1 normal and S2 normal.  Exam reveals no gallop.   No murmur heard. Pulses:      Dorsalis pedis pulses are 2+ on the right side, and 2+ on the left side.  Respiratory: No respiratory distress. She has no wheezes. She has no rhonchi. She has no rales.  GI: Soft. Bowel sounds are normal. There is no tenderness.  Musculoskeletal:       Right  shoulder: She exhibits no swelling.  Lymphadenopathy:    She has no cervical adenopathy.  Neurological: She is alert and oriented to person, place, and time. She has normal strength and normal reflexes. No cranial nerve deficit or sensory deficit.  Skin: Skin is warm. No rash noted. Nails show no clubbing.  Psychiatric: She has a normal mood and affect.    Data Reviewed: Basic Metabolic Panel:  Recent Labs Lab 08/06/14 1825  NA 138  K 3.9  CL 103  CO2 26  GLUCOSE 169*  BUN 17  CREATININE 1.00  CALCIUM 8.9   Liver Function Tests:  Recent Labs Lab 08/06/14 1825  AST 26  ALT 23  ALKPHOS 58  BILITOT 0.6  PROT 6.9  ALBUMIN 3.2*   CBC:  Recent Labs Lab 08/06/14 1825  WBC 10.8  NEUTROABS 7.9*  HGB 13.6  HCT 39.9  MCV 92.9  PLT 141*   Cardiac Enzymes:  Recent Labs Lab 08/06/14 1825  TROPONINI <0.03    Studies: Ct Head Wo Contrast  08/06/2014   CLINICAL DATA:  Acute onset left upper extremity weakness  EXAM: CT HEAD WITHOUT CONTRAST  TECHNIQUE: Contiguous axial images were obtained from the base of the skull through the vertex without intravenous contrast.  COMPARISON:  August 26, 2011 head CT and brain MRI August 27, 2011  FINDINGS: There is age related volume loss. There is no intracranial mass, hemorrhage, extra-axial fluid collection, midline shift. There is evidence of a prior small lacunar infarct in the mid left  centrum semiovale. There is slight small vessel disease in the centra semiovale bilaterally. No acute infarct is evident on this study. Bony calvarium appears intact. The mastoid air cells are clear.  IMPRESSION: Prior small lacunar infarct mid left centrum semiovale. Mild periventricular small vessel disease. No acute infarct evident. No hemorrhage or mass effect.   Electronically Signed   By: Bretta Bang III M.D.   On: 08/06/2014 18:47   Mr Brain Wo Contrast  08/07/2014   CLINICAL DATA:  Stroke.  Left-sided weakness  EXAM: MRI HEAD WITHOUT  CONTRAST  TECHNIQUE: Multiplanar, multiecho pulse sequences of the brain and surrounding structures were obtained without intravenous contrast.  COMPARISON:  CT head 08/06/2014  FINDINGS: Negative for acute infarct.  Mild chronic microvascular ischemia in the white matter, left greater than right. Brainstem and cerebellum normal.  Chronic micro hemorrhage in the left parietal lobe and right thalamus and left pons.  Negative for mass or edema.  No shift of the midline structures  Pituitary not enlarged.  Normal orbit  Moderate mucosal thickening left maxillary sinus. Mild mucosal thickening right maxillary and bilateral ethmoid sinuses.  IMPRESSION: Negative for acute infarct. Mild chronic microvascular ischemic change in the white matter  Sinusitis   Electronically Signed   By: Marlan Palau M.D.   On: 08/07/2014 10:30   US Carotid Bilateral  08/07/2014   CLINICAL DATA:  74 year old female with history of prior cerebral vascular accident. 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  EXAM: BILATERAL CAROTID DUPLEX ULTRASOUND  TECHNIQUE: Wallace Cullens scale imaging, color Doppler and duplex ultrasound were performed of bilateral carotid and vertebral arteries in the neck.  COMPARISON:  Brain MRI 08/06/2014  FINDINGS: Criteria: Quantification of carotid stenosis is based on velocity parameters that correlate the residual internal carotid diameter with NASCET-based stenosis levels, using the diameter of the distal internal carotid lumen as the denominator for stenosis measurement.  The following velocity measurements were obtained:  RIGHT  ICA:  118/37 cm/sec  CCA:  93/17 cm/sec  SYSTOLIC ICA/CCA RATIO:  1.3  DIASTOLIC ICA/CCA RATIO:  2.1  ECA:  110 cm/sec  LEFT  ICA:  107/31 cm/sec  CCA:  122/27 cm/sec  SYSTOLIC ICA/CCA RATIO:  0.9  DIASTOLIC ICA/CCA RATIO:  1.1  ECA:  111 cm/sec  RIGHT CAROTID ARTERY: Heterogeneous and slightly irregular atherosclerotic plaque in the carotid bifurcation extending into the proximal  internal carotid artery. By peak systolic velocity criteria the estimated stenosis remains less than 50%.  RIGHT VERTEBRAL ARTERY:  Patent with normal antegrade flow.  LEFT CAROTID ARTERY: Mild heterogeneous atherosclerotic plaque in the mid common carotid artery and carotid bulb extending into the proximal internal carotid artery. By peak systolic velocity criteria the estimated stenosis remains less than 50%.  LEFT VERTEBRAL ARTERY:  Patent with normal antegrade flow.  IMPRESSION: 1. Mild (1-49%) stenosis proximal right internal carotid artery. 2. Mild (1-49%) stenosis proximal left internal carotid artery. 3. Vertebral arteries are patent with normal antegrade flow. Signed,  Sterling Big, MD  [facility]  [facility]   Electronically Signed   By: Malachy Moan M.D.   On: 08/07/2014 09:18    Scheduled Meds: . aspirin EC  81 mg Oral Daily  . atorvastatin  20 mg Oral Daily  . cholecalciferol  1,000 Units Oral Daily  . clopidogrel  75 mg Oral Daily  . enoxaparin (LOVENOX) injection  40 mg Subcutaneous Q24H  . folic acid  1 mg Oral Daily  .  losartan  100 mg Oral Daily  . multivitamin with minerals   Oral Daily  . QUEtiapine  300 mg Oral QHS  . QUEtiapine  100 mg Oral Daily  . sodium chloride  3 mL Intravenous Q12H    Assessment/Plan:  1. Left-sided weakness has resolved. MRI of the brain is negative. Could potentially be a TIA. Continue Plavix. 2. Abdominal pain and back pain.- I will get an x-ray of the right posterior ribs. Ultrasound of the abdomen to look at the gallbladder. 3. Hypertension- blood pressure elevated today. Continue losartan and restart Coreg. 4. Hyperlipidemia unspecified- continue atorvastatin.  Code Status:     Code Status Orders        Start     Ordered   08/06/14 2137  Full code   Continuous     08/06/14 2136     Family Communication: Family at bedside Disposition Plan: Likely home- either later today  or tomorrow.  Time spent: 30 minutes  Alford Highland  [facility]

## 2014-08-07 NOTE — Plan of Care (Signed)
Problem: Discharge/Transitional Outcomes Goal: Other Discharge Outcomes/Goals Outcome: Progressing Plan of Care Progress to Goals:   Pt alert and oriented. VSS. No c/o pain. Stroke Swallow screen completed in ED. NIH score of 0. MRI and Carotid US to be completed later today.

## 2014-08-07 NOTE — Plan of Care (Signed)
Problem: Discharge/Transitional Outcomes Goal: Other Discharge Outcomes/Goals Outcome: Progressing Plan of care Progress to Goal:  Lsided weakness improved.  MRI negative for infarct.  U/S of carotid shows mild stenosis of R & L carotids. Pt c/o of lower back and flank pain. Was supposed to have CT w/contrast today outpt - may have CT tomorrow.  Pt had U/S of Abd RUQ - results not yet reported to pt and family.

## 2014-08-08 ENCOUNTER — Inpatient Hospital Stay: Payer: Medicare Other

## 2014-08-08 LAB — LIPID PANEL
CHOL/HDL RATIO: 4.1 ratio
Cholesterol: 149 mg/dL (ref 0–200)
HDL: 36 mg/dL — AB (ref >40)
LDL Cholesterol: 90 mg/dL (ref 0–99)
Triglycerides: 116 mg/dL (ref <150)
VLDL: 23 mg/dL (ref 0–40)

## 2014-08-08 MED ORDER — ASPIRIN 81 MG PO TBEC: 81.0000 mg | DELAYED_RELEASE_TABLET | Freq: Every day | ORAL | Status: AC

## 2014-08-08 MED ORDER — IOHEXOL 350 MG/ML SOLN
100.0000 mL | Freq: Once | INTRAVENOUS | Status: AC | PRN
  Administered 2014-08-08: 100 mL via INTRAVENOUS

## 2014-08-08 NOTE — Care Management Important Message (Signed)
Important Message  Patient Details  Name: Victoria Mccarty MRN: [id number] Date of Birth: 03-03-1940   Medicare Important Message Given:  Yes-second notification given    Gwenette Greet 08/08/2014, 8:19 AM

## 2014-08-08 NOTE — Discharge Summary (Signed)
[facility] - [place] at [facility]   PATIENT NAME: Victoria Mccarty    MR#:  [id number]  DATE OF BIRTH:  1940-08-20  DATE OF ADMISSION:  08/06/2014 ADMITTING PHYSICIAN: Auburn Bilberry, MD  DATE OF DISCHARGE: 08/08/2014  PRIMARY CARE PHYSICIAN: Lauro Regulus., MD    ADMISSION DIAGNOSIS:  CVA (cerebral vascular accident) [I63.9] CVA (cerebral infarction) [I63.9] Left arm weakness [R29.898]  DISCHARGE DIAGNOSIS:  Active Problems:   CVA (cerebral infarction)   SECONDARY DIAGNOSIS:   Past Medical History  Diagnosis Date  . Coronary artery disease   . Hypertension   . Stroke   . Depression   . Osteoarthritis   . Low back pain   . Hyperlipemia     HOSPITAL COURSE:   1. Left-sided weakness which resolved. MRI of the brain was negative for acute event. This could potentially be a TIA. Aspirin added to the patient's Plavix. 2. Abdominal pain which had resolved right upper quadrant right back area. Ultrasound and CAT scan were done. Both studies reflected an aortic aneurysm measuring 5.3 x 4.9 cm and an abnormal appearance of the right kidney with cystic masses. I spoke with Dr. Wyn Quaker vascular surgery and he will follow-up as outpatient in a couple weeks to discuss what type of procedure can be done in the future. He was not concerned about the mural thrombus there. We will refer to urology for the cystic masses on the kidney. The patient has had a prior operation in the past at [facility] with this but that was many years ago and that was benign at that time this could be a recurrence of that process. 3. Abdominal aortic aneurysm- follow-up with Dr. Wyn Quaker as outpatient. 4. Abnormal cystic masses on the right kidney.- Urology referral as outpatient. 5. Essential hypertension continue usual medications 6. Hyperlipidemia unspecified LDL less than 100 which is at goal continue atorvastatin   DISCHARGE CONDITIONS:   Satisfactory  CONSULTS OBTAINED:  Treatment  Team:  Auburn Bilberry, MD  DRUG ALLERGIES:  No Known Allergies  DISCHARGE MEDICATIONS:   Current Discharge Medication List    START taking these medications   Details  aspirin EC 81 MG EC tablet Take 1 tablet (81 mg total) by mouth daily. Qty: 30 tablet, Refills: 0      CONTINUE these medications which have NOT CHANGED   Details  atorvastatin (LIPITOR) 20 MG tablet Take 20 mg by mouth daily.    B Complex Vitamins (VITAMIN B COMPLEX PO) Take 1 tablet by mouth daily.    carvedilol (COREG) 25 MG tablet Take 25 mg by mouth 2 (two) times daily.    cholecalciferol (VITAMIN D) 1000 UNITS tablet Take 1,000 Units by mouth daily.    clopidogrel (PLAVIX) 75 MG tablet Take 75 mg by mouth daily.    folic acid (FOLVITE) 800 MCG tablet Take 800 mcg by mouth daily.    losartan (COZAAR) 100 MG tablet Take 100 mg by mouth daily.    QUEtiapine (SEROQUEL XR) 300 MG 24 hr tablet Take 300 mg by mouth at bedtime.    QUEtiapine (SEROQUEL) 100 MG tablet Take 100 mg by mouth daily.      STOP taking these medications     ibuprofen (ADVIL,MOTRIN) 200 MG tablet          DISCHARGE INSTRUCTIONS:   Follow-up with Dr. Einar Crow 1 week. Follow-up with Dr. Wyn Quaker vascular surgery in 2 weeks. Follow-up with urology in a few weeks.  If you experience worsening of your admission  symptoms, develop shortness of breath, life threatening emergency, suicidal or homicidal thoughts you must seek medical attention immediately by calling 911 or calling your MD immediately  if symptoms less severe.  You Must read complete instructions/literature along with all the possible adverse reactions/side effects for all the Medicines you take and that have been prescribed to you. Take any new Medicines after you have completely understood and accept all the possible adverse reactions/side effects.   Please note  You were cared for by a hospitalist during your hospital stay. If you have any questions about your  discharge medications or the care you received while you were in the hospital after you are discharged, you can call the unit and asked to speak with the hospitalist on call if the hospitalist that took care of you is not available. Once you are discharged, your primary care physician will handle any further medical issues. Please note that NO REFILLS for any discharge medications will be authorized once you are discharged, as it is imperative that you return to your primary care physician (or establish a relationship with a primary care physician if you do not have one) for your aftercare needs so that they can reassess your need for medications and monitor your lab values.    Today   CHIEF COMPLAINT:   Chief Complaint  Patient presents with  . Weakness    HISTORY OF PRESENT ILLNESS:  Victoria Mccarty  is a 74 y.o. female with a known history of CVA presented with left-sided weakness and then also developed abdominal pain while here.   VITAL SIGNS:  Blood pressure 158/65, pulse 75, temperature 98.2 F (36.8 C), temperature source Oral, resp. rate 19, height 5\' 8"  (1.727 m), weight 70.625 kg (155 lb 11.2 oz), SpO2 94 %.  I/O:   Intake/Output Summary (Last 24 hours) at 08/08/14 1306 Last data filed at 08/08/14 0751  Gross per 24 hour  Intake      3 ml  Output      0 ml  Net      3 ml    PHYSICAL EXAMINATION:  GENERAL:  74 y.o.-year-old patient lying in the bed with no acute distress.  EYES: Pupils equal, round, reactive to light and accommodation. No scleral icterus. Extraocular muscles intact.  HEENT: Head atraumatic, normocephalic. Oropharynx and nasopharynx clear.  NECK:  Supple, no jugular venous distention. No thyroid enlargement, no tenderness.  LUNGS: Normal breath sounds bilaterally, no wheezing, rales,rhonchi or crepitation. No use of accessory muscles of respiration.  CARDIOVASCULAR: S1, S2 normal. No murmurs, rubs, or gallops.  ABDOMEN: Soft, non-tender, non-distended.  Bowel sounds present. No organomegaly or mass.  EXTREMITIES: No pedal edema, cyanosis, or clubbing.  NEUROLOGIC: Cranial nerves II through XII are intact. Muscle strength 5/5 in all extremities. Sensation intact. Gait not checked.  PSYCHIATRIC: The patient is alert and oriented x 3.  SKIN: No obvious rash, lesion, or ulcer.   DATA REVIEW:   CBC  Recent Labs Lab 08/06/14 1825  WBC 10.8  HGB 13.6  HCT 39.9  PLT 141*    Chemistries   Recent Labs Lab 08/06/14 1825  NA 138  K 3.9  CL 103  CO2 26  GLUCOSE 169*  BUN 17  CREATININE 1.00  CALCIUM 8.9  AST 26  ALT 23  ALKPHOS 58  BILITOT 0.6    Cardiac Enzymes  Recent Labs Lab 08/06/14 1825  TROPONINI <0.03    Microbiology Results  No results found for this or any previous visit.  RADIOLOGY:  Ct Abdomen Pelvis W Wo Contrast  08/08/2014   CLINICAL DATA:  Subsequent evaluation of a 74 year old female with recently diagnosed abdominal aortic aneurysm and renal mass. Followup evaluation.  EXAM: CT ABDOMEN AND PELVIS WITHOUT AND WITH CONTRAST  TECHNIQUE: Multidetector CT imaging of the abdomen and pelvis was performed following the standard protocol before and following the bolus administration of intravenous contrast.  CONTRAST:  OMNIPAQUE IOHEXOL 350 MG/ML SOLN  COMPARISON:  Abdominal ultrasound 08/07/2014.  FINDINGS: Lower chest: Scarring in the dependent portions of the lung bases bilaterally, where there is extensive pleuroparenchymal thickening.  Hepatobiliary: No suspicious appearing cystic or solid hepatic lesions. No intra or extrahepatic biliary ductal dilatation. Numerous small calcified gallstones layer dependently in the gallbladder. No current findings to suggest acute cholecystitis at this time.  Pancreas: No pancreatic mass. No pancreatic ductal dilatation. No pancreatic or peripancreatic fluid or inflammatory changes.  Spleen: Unremarkable.  Adrenals/Urinary Tract: Irregularity in the lateral aspect of the  interpolar region of the right kidney where there is a large cortical defects, replaced by fat, presumably at a site of prior partial nephrectomy, potentially with fat packing. Above this extending from the interpolar to upper pole region in the lateral aspect of the right kidney there is a very unusual appearing area measuring approximately 5.6 x 2.4 cm, which appears to conform to the normal renal contour, but contains multiple internal cystic spaces separated by a thick enhancing septations. Multiple other low-attenuation lesions are noted in the kidneys bilaterally, the largest of which are compatible with simple cysts, while the smaller lesions are too small to definitively characterize, but are also favored to represent tiny cysts. The largest simple cyst measures 3.4 cm in the lower pole of the right kidney. Multifocal cortical thinning is also evident in the left kidney, presumably post infectious or inflammatory scarring. Multiple calcifications are noted in the renal hila bilaterally, favored to represent vascular calcifications rather than nonobstructive calculi. No hydroureteronephrosis. Urinary bladder is normal in appearance. Bilateral adrenal glands are normal in appearance.  Stomach/Bowel: Normal appearance of the stomach. No pathologic dilatation of small bowel or colon. A few scattered colonic diverticulae are noted, particularly in the sigmoid colon, without surrounding inflammatory changes to suggest acute diverticulitis at this time.  Vascular/Lymphatic: Extensive atherosclerosis throughout the abdominal and pelvic vasculature, including a large fusiform infrarenal abdominal aortic aneurysm which measures up to 5.3 x 4.9 cm, and contains a large burden of mural thrombus and/or atheromatous plaque. There is also aneurysmal dilatation of the common iliac arteries bilaterally measuring up to 18 mm on the right and 19 mm on the left in diameter. Renal veins are patent bilaterally. No lymphadenopathy  noted in the abdomen or pelvis.  Reproductive: Status post hysterectomy.  Ovaries are atrophic.  Other: No significant volume of ascites.  No pneumoperitoneum.  Musculoskeletal: There are no aggressive appearing lytic or blastic lesions noted in the visualized portions of the skeleton.  IMPRESSION: 1. Extensive atherosclerosis, including a 5.3 x 4.9 cm fusiform infrarenal abdominal aortic aneurysm and bilateral common iliac artery aneurysms measuring 18 mm on the right and 19 mm on the left. Recommend followup by abdomen and pelvis CTA in 3-6 months, and vascular surgery referral/consultation if not already obtained. This recommendation follows ACR consensus guidelines: White Paper of the ACR Incidental Findings Committee II on Vascular Findings. J Am Coll Radiol 2013; 10:789-794. 2. Highly unusual appearance of the right kidney, which is partially accounted for by probable prior partial nephrectomy. Importantly, however,  in the interpolar to upper pole region of the right kidney laterally there is a very complex appearing cystic lesion with multiple thick internal enhancing septations measuring approximately 5.6 x 2.4 cm. 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 Despite its unusual appearance, the lack of contour abnormality of the overlying renal capsule, and the lack of effacement of the adjacent renal sinus favors a benign entity such as localized cystic renal disease. However, Urologic consultation is strongly recommended for further evaluation and to guide future followup, as malignancy is not excluded. 3. Cholelithiasis without evidence of acute cholecystitis. 4. Colonic diverticulosis without evidence of acute diverticulitis. 5. Additional incidental findings, as above.   Electronically Signed   By: Trudie Reed M.D.   On: 08/08/2014 12:37   Dg Ribs Unilateral Right  08/07/2014   CLINICAL DATA:  Right lower posterior rib pain  EXAM: RIGHT RIBS - 2 VIEW  COMPARISON:  Chest radiograph dated  08/26/2011  FINDINGS: Mild lingular scarring/atelectasis.  Mild left basilar atelectasis.  The heart is normal in size. Postsurgical changes related to prior CABG. Vascular stent in the left superior mediastinum.  No displaced right rib fracture is seen.  IMPRESSION: No evidence of acute cardiopulmonary disease.  No displaced right rib fracture is seen.   Electronically Signed   By: Charline Bills M.D.   On: 08/07/2014 14:48   Ct Head Wo Contrast  08/06/2014   CLINICAL DATA:  Acute onset left upper extremity weakness  EXAM: CT HEAD WITHOUT CONTRAST  TECHNIQUE: Contiguous axial images were obtained from the base of the skull through the vertex without intravenous contrast.  COMPARISON:  August 26, 2011 head CT and brain MRI August 27, 2011  FINDINGS: There is age related volume loss. There is no intracranial mass, hemorrhage, extra-axial fluid collection, midline shift. There is evidence of a prior small lacunar infarct in the mid left centrum semiovale. There is slight small vessel disease in the centra semiovale bilaterally. No acute infarct is evident on this study. Bony calvarium appears intact. The mastoid air cells are clear.  IMPRESSION: Prior small lacunar infarct mid left centrum semiovale. Mild periventricular small vessel disease. No acute infarct evident. No hemorrhage or mass effect.   Electronically Signed   By: Bretta Bang III M.D.   On: 08/06/2014 18:47   Mr Brain Wo Contrast  08/07/2014   CLINICAL DATA:  Stroke.  Left-sided weakness  EXAM: MRI HEAD WITHOUT CONTRAST  TECHNIQUE: Multiplanar, multiecho pulse sequences of the brain and surrounding structures were obtained without intravenous contrast.  COMPARISON:  CT head 08/06/2014  FINDINGS: Negative for acute infarct.  Mild chronic microvascular ischemia in the white matter, left greater than right. Brainstem and cerebellum normal.  Chronic micro hemorrhage in the left parietal lobe and right thalamus and left pons.  Negative for mass  or edema.  No shift of the midline structures  Pituitary not enlarged.  Normal orbit  Moderate mucosal thickening left maxillary sinus. Mild mucosal thickening right maxillary and bilateral ethmoid sinuses.  IMPRESSION: Negative for acute infarct. Mild chronic microvascular ischemic change in the white matter  Sinusitis   Electronically Signed   By: Marlan Palau M.D.   On: 08/07/2014 10:30   US Carotid Bilateral  08/07/2014   CLINICAL DATA:  74 year old female with history of prior cerebral vascular accident. 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  EXAM: BILATERAL CAROTID DUPLEX ULTRASOUND  TECHNIQUE: Wallace Cullens scale imaging, color Doppler and duplex ultrasound were performed of bilateral  carotid and vertebral arteries in the neck.  COMPARISON:  Brain MRI 08/06/2014  FINDINGS: Criteria: Quantification of carotid stenosis is based on velocity parameters that correlate the residual internal carotid diameter with NASCET-based stenosis levels, using the diameter of the distal internal carotid lumen as the denominator for stenosis measurement.  The following velocity measurements were obtained:  RIGHT  ICA:  118/37 cm/sec  CCA:  93/17 cm/sec  SYSTOLIC ICA/CCA RATIO:  1.3  DIASTOLIC ICA/CCA RATIO:  2.1  ECA:  110 cm/sec  LEFT  ICA:  107/31 cm/sec  CCA:  122/27 cm/sec  SYSTOLIC ICA/CCA RATIO:  0.9  DIASTOLIC ICA/CCA RATIO:  1.1  ECA:  111 cm/sec  RIGHT CAROTID ARTERY: Heterogeneous and slightly irregular atherosclerotic plaque in the carotid bifurcation extending into the proximal internal carotid artery. By peak systolic velocity criteria the estimated stenosis remains less than 50%.  RIGHT VERTEBRAL ARTERY:  Patent with normal antegrade flow.  LEFT CAROTID ARTERY: Mild heterogeneous atherosclerotic plaque in the mid common carotid artery and carotid bulb extending into the proximal internal carotid artery. By peak systolic velocity criteria the estimated stenosis remains less than 50%.  LEFT VERTEBRAL  ARTERY:  Patent with normal antegrade flow.  IMPRESSION: 1. Mild (1-49%) stenosis proximal right internal carotid artery. 2. Mild (1-49%) stenosis proximal left internal carotid artery. 3. Vertebral arteries are patent with normal antegrade flow. Signed,  Sterling Big, MD  [facility]  [facility]   Electronically Signed   By: Malachy Moan M.D.   On: 08/07/2014 09:18   US Abdomen Limited Ruq  08/07/2014   CLINICAL DATA:  Abdominal pain for the past 4 days.  EXAM: US ABDOMEN LIMITED - RIGHT UPPER QUADRANT  COMPARISON:  Abdomen CT report dated 10/06/2011.  FINDINGS: Gallbladder:  Multiple small gallstones in the gallbladder. The largest measures 6 mm in maximum diameter. No gallbladder wall thickening or pericholecystic fluid. The patient was not focally tender over the gallbladder.  Common bile duct:  Diameter: 4.3 mm  Liver:  No focal lesion identified. Within normal limits in parenchymal echogenicity.  Additional findings: Focal aneurysmal dilatation of the distal abdominal aorta measuring 5.7 cm in length, 4.9 cm in transverse diameter and 5.1 cm in AP diameter. This contains eccentric mural thrombus. There is also a lower pole left renal cyst measuring 4.0 x 4.0 cm on sagittal image number 37. A solid component cannot be excluded on this image.  IMPRESSION: 1. Distal abdominal aortic aneurysm measuring 5.1 cm in maximum AP diameter. The size of the aneurysm places the patient at increased risk of rupture. Recommend followup by abdomen and pelvis CTA in 3-6 months, and vascular surgery referral/consultation if not already obtained. This recommendation follows ACR consensus guidelines: White Paper of the ACR Incidental Findings Committee II on Vascular Findings. J Am Coll Radiol 2013; 10:789-794. 2. Cholelithiasis without evidence of cholecystitis. 3. 4.0 cm possible complex lower pole right renal cyst. Further evaluation with pre and postcontrast CT or  magnetic resonance imaging is recommended.  These results will be called to the ordering clinician or representative by the Radiologist Assistant, and communication documented in the PACS or zVision Dashboard.   Electronically Signed   By: Beckie Salts M.D.   On: 08/07/2014 17:34    Management plans discussed with the patient, family and they are in agreement.  CODE STATUS:     Code Status Orders        Start     Ordered   08/06/14 2137  Full code   Continuous     08/06/14 2136      TOTAL TIME TAKING CARE OF THIS PATIENT: 35 minutes.    Alford Highland M.D on 08/08/2014 at 1:06 PM  Between 7am to 6pm - Pager - [phone]  After 6pm go to [email or web address] - password [email or web address] [facility]  [place]  [facility]  Office  [phone]  CC: Primary care physician; Lauro Regulus., MD

## 2014-08-08 NOTE — Plan of Care (Signed)
Problem: Discharge Progression Outcomes Goal: Discharge plan in place and appropriate Individualism Outcome: Progressing Pt likes to be called Victoria Mccarty. HOH...has hearing aids    Goal: Other Discharge Outcomes/Goals Outcome: Progressing Plan of care progress to goal: Pain - no complaints of pain this shift Hemodynamically stable - VSS Complications - weakness resolved, no pain complaints Diet - tolerating Activity - pt up ad lib

## 2014-08-08 NOTE — Care Management (Signed)
Admitted to [facility] with the diagnosis of CVA. Lives at home with husband, Molly Maduro [phone]). No home health. No skilled facility. Uses no oxygen in the home. Takes care of all [place] activities of daily living herself. Sees Dr. Einar Crow as primary care physician.  Lower back pain. Scheduled for CT today. Gwenette Greet RN MSN Care management [phone]

## 2014-08-08 NOTE — Progress Notes (Signed)
MD order to discharge. Reviewed discharge instructions with patient.  IV discontinued.  Pt discharged

## 2014-08-21 ENCOUNTER — Encounter
Admission: RE | Admit: 2014-08-21 | Discharge: 2014-08-21 | Disposition: A | Discharge status: Home / Self Care | Payer: Medicare Other | Source: Ambulatory Visit | Attending: Vascular Surgery | Admitting: Vascular Surgery

## 2014-08-21 DIAGNOSIS — Z01812 Encounter for preprocedural laboratory examination: Secondary | ICD-10-CM | POA: Insufficient documentation

## 2014-08-21 HISTORY — DX: Shortness of breath: R06.02

## 2014-08-21 HISTORY — DX: Acute myocardial infarction, unspecified: I21.9

## 2014-08-21 HISTORY — DX: Unspecified psychosis not due to a substance or known physiological condition: F29

## 2014-08-21 HISTORY — DX: Bipolar disorder, unspecified: F31.9

## 2014-08-21 HISTORY — DX: Cyst of kidney, acquired: N28.1

## 2014-08-21 HISTORY — DX: Transient cerebral ischemic attack, unspecified: G45.9

## 2014-08-21 LAB — APTT: aPTT: 25 seconds (ref 24–36)

## 2014-08-21 LAB — BASIC METABOLIC PANEL
Anion gap: 10 (ref 5–15)
BUN: 21 mg/dL — AB (ref 6–20)
CALCIUM: 9.7 mg/dL (ref 8.9–10.3)
CO2: 29 mmol/L (ref 22–32)
Chloride: 100 mmol/L — ABNORMAL LOW (ref 101–111)
Creatinine, Ser: 0.96 mg/dL (ref 0.44–1.00)
GFR calc Af Amer: >60 mL/min (ref >60)
GFR, EST NON AFRICAN AMERICAN: 57 mL/min — AB (ref >60)
GLUCOSE: 98 mg/dL (ref 65–99)
Potassium: 4.1 mmol/L (ref 3.5–5.1)
Sodium: 139 mmol/L (ref 135–145)

## 2014-08-21 LAB — CBC
HCT: 42.9 % (ref 35.0–47.0)
Hemoglobin: 14.3 g/dL (ref 12.0–16.0)
MCH: 31.1 pg (ref 26.0–34.0)
MCHC: 33.4 g/dL (ref 32.0–36.0)
MCV: 93.2 fL (ref 80.0–100.0)
PLATELETS: 225 10*3/uL (ref 150–440)
RBC: 4.6 MIL/uL (ref 3.80–5.20)
RDW: 13.8 % (ref 11.5–14.5)
WBC: 9.5 10*3/uL (ref 3.6–11.0)

## 2014-08-21 LAB — PROTIME-INR
INR: 1.03
Prothrombin Time: 13.7 seconds (ref 11.4–15.0)

## 2014-08-21 NOTE — Patient Instructions (Signed)
  Your procedure is scheduled on: 08/27/14 Wed  Report to Day Surgery. To find out your arrival time please call [phone] between 1PM - 3PM on 08/26/14 Tues.  Remember: Instructions that are not followed completely may result in serious medical risk, up to and including death, or upon the discretion of your surgeon and anesthesiologist your surgery may need to be rescheduled.    _x___ 1. Do not eat food or drink liquids after midnight. No gum chewing or hard candies.     ____ 2. No Alcohol for 24 hours before or after surgery.   ____ 3. Bring all medications with you on the day of surgery if instructed.    __x__ 4. Notify your doctor if there is any change in your medical condition     (cold, fever, infections).     Do not wear jewelry, make-up, hairpins, clips or nail polish.  Do not wear lotions, powders, or perfumes. You may wear deodorant.  Do not shave 48 hours prior to surgery. Men may shave face and neck.  Do not bring valuables to the hospital.    [facility] is not responsible for any belongings or valuables.               Contacts, dentures or bridgework may not be worn into surgery.  Leave your suitcase in the car. After surgery it may be brought to your room.  For patients admitted to the hospital, discharge time is determined by your                treatment team.   Patients discharged the day of surgery will not be allowed to drive home.   Please read over the following fact sheets that you were given:      __x__ Take these medicines the morning of surgery with A SIP OF WATER:    1. atorvastatin (LIPITOR) 20 MG tablet  2. carvedilol (COREG) 25 MG tablet   3. losartan (COZAAR) 100 MG tablet  4.QUEtiapine (SEROQUEL) 100 MG tablet  5.  6.  ____ Fleet Enema (as directed)   _x___ Use CHG Soap as directed  ____ Use inhalers on the day of surgery  ____ Stop metformin 2 days prior to surgery    ____ Take 1/2 of usual insulin dose the night before surgery and  none on the morning of surgery.   _x___ Stop Coumadin/Plavix/aspirin on Stop Plavix 5 days before surgery.  Check with Dr Juliann Pares tomorrow to see if OK.  ____ Stop Anti-inflammatories on    __x__ Stop supplements until after surgery.  Stop Krill oil til after surgery.  ____ Bring C-Pap to the hospital.

## 2014-08-22 ENCOUNTER — Ambulatory Visit: Payer: Self-pay | Admitting: Urology

## 2014-08-22 LAB — ABO/RH: ABO/RH(D): O POS

## 2014-08-27 ENCOUNTER — Encounter
Admission: AD | Disposition: A | Discharge status: Home / Self Care | Payer: Self-pay | Source: Ambulatory Visit | Attending: Vascular Surgery

## 2014-08-27 ENCOUNTER — Ambulatory Visit: Payer: Medicare Other | Admitting: Certified Registered Nurse Anesthetist

## 2014-08-27 ENCOUNTER — Encounter: Payer: Self-pay | Admitting: *Deleted

## 2014-08-27 ENCOUNTER — Inpatient Hospital Stay
Admission: AD | Admit: 2014-08-27 | Discharge: 2014-08-29 | DRG: 269 | Disposition: A | Discharge status: Home / Self Care | Payer: Medicare Other | Source: Ambulatory Visit | Attending: Vascular Surgery | Admitting: Vascular Surgery

## 2014-08-27 DIAGNOSIS — F17210 Nicotine dependence, cigarettes, uncomplicated: Secondary | ICD-10-CM | POA: Diagnosis present

## 2014-08-27 DIAGNOSIS — M1990 Unspecified osteoarthritis, unspecified site: Secondary | ICD-10-CM | POA: Diagnosis present

## 2014-08-27 DIAGNOSIS — E785 Hyperlipidemia, unspecified: Secondary | ICD-10-CM | POA: Diagnosis present

## 2014-08-27 DIAGNOSIS — I10 Essential (primary) hypertension: Secondary | ICD-10-CM | POA: Diagnosis present

## 2014-08-27 DIAGNOSIS — I70213 Atherosclerosis of native arteries of extremities with intermittent claudication, bilateral legs: Secondary | ICD-10-CM | POA: Diagnosis present

## 2014-08-27 DIAGNOSIS — Z951 Presence of aortocoronary bypass graft: Secondary | ICD-10-CM

## 2014-08-27 DIAGNOSIS — I714 Abdominal aortic aneurysm, without rupture, unspecified: Secondary | ICD-10-CM | POA: Diagnosis present

## 2014-08-27 DIAGNOSIS — Z8673 Personal history of transient ischemic attack (TIA), and cerebral infarction without residual deficits: Secondary | ICD-10-CM

## 2014-08-27 DIAGNOSIS — Z8249 Family history of ischemic heart disease and other diseases of the circulatory system: Secondary | ICD-10-CM | POA: Diagnosis not present

## 2014-08-27 DIAGNOSIS — I2510 Atherosclerotic heart disease of native coronary artery without angina pectoris: Secondary | ICD-10-CM | POA: Diagnosis present

## 2014-08-27 DIAGNOSIS — I252 Old myocardial infarction: Secondary | ICD-10-CM | POA: Diagnosis not present

## 2014-08-27 DIAGNOSIS — Z79899 Other long term (current) drug therapy: Secondary | ICD-10-CM | POA: Diagnosis not present

## 2014-08-27 DIAGNOSIS — Q2543 Congenital aneurysm of aorta: Secondary | ICD-10-CM

## 2014-08-27 LAB — PREPARE RBC (CROSSMATCH)

## 2014-08-27 SURGERY — ENDOVASCULAR REPAIR/STENT GRAFT
Anesthesia: General

## 2014-08-27 MED ORDER — ACETAMINOPHEN 325 MG PO TABS
650.0000 mg | ORAL_TABLET | Freq: Four times a day (QID) | ORAL | Status: DC | PRN
Start: 2014-08-27 — End: 2014-08-29

## 2014-08-27 MED ORDER — FAMOTIDINE 20 MG PO TABS
20.0000 mg | ORAL_TABLET | Freq: Once | ORAL | Status: AC
  Administered 2014-08-27: 20 mg via ORAL

## 2014-08-27 MED ORDER — LACTATED RINGERS IV SOLN
INTRAVENOUS | Status: DC
  Administered 2014-08-27: 10:00:00 via INTRAVENOUS

## 2014-08-27 MED ORDER — MAGNESIUM HYDROXIDE 400 MG/5ML PO SUSP: 30.0000 mL | Freq: Every day | ORAL | Status: DC | PRN

## 2014-08-27 MED ORDER — ACETAMINOPHEN 650 MG RE SUPP: 650.0000 mg | Freq: Four times a day (QID) | RECTAL | Status: DC | PRN

## 2014-08-27 MED ORDER — FLEET ENEMA 7-19 GM/118ML RE ENEM: 1.0000 | ENEMA | Freq: Once | RECTAL | Status: DC | PRN

## 2014-08-27 MED ORDER — ENOXAPARIN SODIUM 40 MG/0.4ML ~~LOC~~ SOLN
40.0000 mg | SUBCUTANEOUS | Status: DC
  Administered 2014-08-28: 40 mg via SUBCUTANEOUS
  Filled 2014-08-27: qty 0.4

## 2014-08-27 MED ORDER — SODIUM CHLORIDE 0.9 % IV SOLN
INTRAVENOUS | Status: DC
  Administered 2014-08-27 – 2014-08-28 (×3): via INTRAVENOUS

## 2014-08-27 MED ORDER — ONDANSETRON HCL 4 MG PO TABS: 4.0000 mg | ORAL_TABLET | Freq: Four times a day (QID) | ORAL | Status: DC | PRN

## 2014-08-27 MED ORDER — FENTANYL CITRATE (PF) 100 MCG/2ML IJ SOLN: 25.0000 ug | INTRAMUSCULAR | Status: DC | PRN

## 2014-08-27 MED ORDER — DOCUSATE SODIUM 100 MG PO CAPS
100.0000 mg | ORAL_CAPSULE | Freq: Two times a day (BID) | ORAL | Status: DC
  Administered 2014-08-27 – 2014-08-29 (×4): 100 mg via ORAL
  Filled 2014-08-27 (×4): qty 1

## 2014-08-27 MED ORDER — MORPHINE SULFATE (PF) 4 MG/ML IV SOLN: 2.0000 mg | INTRAVENOUS | Status: DC | PRN

## 2014-08-27 MED ORDER — HYDROCODONE-ACETAMINOPHEN 5-325 MG PO TABS
1.0000 | ORAL_TABLET | ORAL | Status: DC | PRN
  Administered 2014-08-28: 2 via ORAL
  Filled 2014-08-27: qty 2

## 2014-08-27 MED ORDER — ASPIRIN EC 81 MG PO TBEC: 81.0000 mg | DELAYED_RELEASE_TABLET | Freq: Every day | ORAL | Status: DC

## 2014-08-27 MED ORDER — FAMOTIDINE 20 MG PO TABS
ORAL_TABLET | ORAL | Status: AC
  Administered 2014-08-27: 20 mg via ORAL
  Filled 2014-08-27: qty 1

## 2014-08-27 MED ORDER — SORBITOL 70 % SOLN
30.0000 mL | Freq: Every day | Status: DC | PRN
  Filled 2014-08-27: qty 30

## 2014-08-27 MED ORDER — CEFAZOLIN SODIUM 1-5 GM-% IV SOLN: 1.0000 g | Freq: Once | INTRAVENOUS | Status: DC

## 2014-08-27 MED ORDER — DEXTROSE-NACL 5-0.9 % IV SOLN: INTRAVENOUS | Status: DC

## 2014-08-27 MED ORDER — ONDANSETRON HCL 4 MG/2ML IJ SOLN
4.0000 mg | Freq: Four times a day (QID) | INTRAMUSCULAR | Status: DC | PRN
  Administered 2014-08-28: 4 mg via INTRAVENOUS

## 2014-08-27 MED ORDER — ONDANSETRON HCL 4 MG/2ML IJ SOLN: 4.0000 mg | Freq: Once | INTRAMUSCULAR | Status: DC | PRN

## 2014-08-27 MED ORDER — ALUM & MAG HYDROXIDE-SIMETH 200-200-20 MG/5ML PO SUSP: 30.0000 mL | Freq: Four times a day (QID) | ORAL | Status: DC | PRN

## 2014-08-27 NOTE — Progress Notes (Signed)
patient transferred to 219, foley removed per pt request

## 2014-08-27 NOTE — Sedation Documentation (Signed)
Patient is resting comfortably. 

## 2014-08-27 NOTE — Anesthesia Preprocedure Evaluation (Addendum)
Anesthesia Evaluation  Patient identified by MRN, date of birth, ID band Patient awake    Reviewed: Allergy & Precautions, NPO status , Patient's Chart, lab work & pertinent test results, reviewed documented beta blocker date and time   Airway Mallampati: II  TM Distance: >3 FB Neck ROM: Full    Dental  (+) Upper Dentures   Pulmonary shortness of breath, Current Smoker,    Pulmonary exam normal       Cardiovascular hypertension, + CAD and + Past MI Normal cardiovascular exam    Neuro/Psych Anxiety Depression Bipolar Disorder TIA   GI/Hepatic negative GI ROS, Neg liver ROS,   Endo/Other  negative endocrine ROS  Renal/GU Kidney cysts  negative genitourinary   Musculoskeletal  (+) Arthritis -, Osteoarthritis,    Abdominal Normal abdominal exam  (+)   Peds negative pediatric ROS (+)  Hematology negative hematology ROS (+)   Anesthesia Other Findings   Reproductive/Obstetrics                            Anesthesia Physical Anesthesia Plan  ASA: III  Anesthesia Plan: General   Post-op Pain Management:    Induction: Intravenous  Airway Management Planned: Oral ETT  Additional Equipment:   Intra-op Plan:   Post-operative Plan: Extubation in OR  Informed Consent: I have reviewed the patients History and Physical, chart, labs and discussed the procedure including the risks, benefits and alternatives for the proposed anesthesia with the patient or authorized representative who has indicated his/her understanding and acceptance.   Dental advisory given  Plan Discussed with: CRNA and Surgeon  Anesthesia Plan Comments:         Anesthesia Quick Evaluation

## 2014-08-27 NOTE — Progress Notes (Signed)
Report to [facility] on 2nd floor.

## 2014-08-27 NOTE — H&P (Signed)
  [facility] History & Physical Update  The patient was interviewed and re-examined.  The patient's previous History and Physical has been reviewed and is unchanged.  There is no change in the plan of care. We plan to proceed with the scheduled procedure.  Kashten Gowin, MD  08/27/2014, 3:01 PM

## 2014-08-27 NOTE — H&P (Signed)
  Due to issues with the angiography room, the patient's case was delayed today and will now be rescheduled for tomorrow morning. She is being admitted for hydration and her surgery has been scheduled for 7:30 tomorrow morning. She was a very difficult IV stick, and getting more intravenous access would be extremely difficult if she was discharged home and brought back in the morning. She has also been kept nothing by mouth all day, and we will hydrate her to try to reduce nephrotoxic effects of the dye.

## 2014-08-28 ENCOUNTER — Encounter
Admission: AD | Disposition: A | Discharge status: Home / Self Care | Payer: Self-pay | Source: Ambulatory Visit | Attending: Vascular Surgery

## 2014-08-28 ENCOUNTER — Inpatient Hospital Stay: Payer: Medicare Other | Admitting: Anesthesiology

## 2014-08-28 ENCOUNTER — Encounter: Payer: Self-pay | Admitting: Anesthesiology

## 2014-08-28 HISTORY — PX: PERIPHERAL VASCULAR CATHETERIZATION: SHX172C

## 2014-08-28 LAB — BASIC METABOLIC PANEL
Anion gap: 8 (ref 5–15)
BUN: 12 mg/dL (ref 6–20)
CHLORIDE: 108 mmol/L (ref 101–111)
CO2: 25 mmol/L (ref 22–32)
CREATININE: 0.93 mg/dL (ref 0.44–1.00)
Calcium: 9.1 mg/dL (ref 8.9–10.3)
GFR calc Af Amer: >60 mL/min (ref >60)
GFR calc non Af Amer: 60 mL/min — ABNORMAL LOW (ref >60)
GLUCOSE: 102 mg/dL — AB (ref 65–99)
Potassium: 4 mmol/L (ref 3.5–5.1)
Sodium: 141 mmol/L (ref 135–145)

## 2014-08-28 LAB — CBC
HCT: 41.4 % (ref 35.0–47.0)
Hemoglobin: 14.3 g/dL (ref 12.0–16.0)
MCH: 32.2 pg (ref 26.0–34.0)
MCHC: 34.4 g/dL (ref 32.0–36.0)
MCV: 93.6 fL (ref 80.0–100.0)
PLATELETS: 150 10*3/uL (ref 150–440)
RBC: 4.43 MIL/uL (ref 3.80–5.20)
RDW: 14.1 % (ref 11.5–14.5)
WBC: 8.6 10*3/uL (ref 3.6–11.0)

## 2014-08-28 LAB — GLUCOSE, CAPILLARY: Glucose-Capillary: 104 mg/dL — ABNORMAL HIGH (ref 65–99)

## 2014-08-28 SURGERY — ENDOVASCULAR REPAIR/STENT GRAFT
Anesthesia: General | Laterality: Bilateral

## 2014-08-28 MED ORDER — ATORVASTATIN CALCIUM 20 MG PO TABS
20.0000 mg | ORAL_TABLET | Freq: Every day | ORAL | Status: DC
Start: 2014-08-28 — End: 2014-08-29
  Administered 2014-08-28 – 2014-08-29 (×2): 20 mg via ORAL
  Filled 2014-08-28 (×2): qty 1

## 2014-08-28 MED ORDER — VITAMIN D 1000 UNITS PO TABS
1000.0000 [IU] | ORAL_TABLET | Freq: Every day | ORAL | Status: DC
  Administered 2014-08-28 – 2014-08-29 (×2): 1000 [IU] via ORAL
  Filled 2014-08-28 (×2): qty 1

## 2014-08-28 MED ORDER — SODIUM CHLORIDE 0.9 % IV SOLN
10000.0000 ug | INTRAVENOUS | Status: DC | PRN
  Administered 2014-08-28: 25 ug/min via INTRAVENOUS

## 2014-08-28 MED ORDER — ONDANSETRON HCL 4 MG/2ML IJ SOLN: 4.0000 mg | Freq: Once | INTRAMUSCULAR | Status: DC | PRN

## 2014-08-28 MED ORDER — OMEGA-3-ACID ETHYL ESTERS 1 G PO CAPS
1.0000 | ORAL_CAPSULE | Freq: Every day | ORAL | Status: DC
  Administered 2014-08-28 – 2014-08-29 (×2): 1 g via ORAL
  Filled 2014-08-28 (×2): qty 1

## 2014-08-28 MED ORDER — HEPARIN SODIUM (PORCINE) 1000 UNIT/ML IJ SOLN
INTRAMUSCULAR | Status: AC
  Filled 2014-08-28: qty 1

## 2014-08-28 MED ORDER — SUGAMMADEX SODIUM 200 MG/2ML IV SOLN
INTRAVENOUS | Status: DC | PRN
  Administered 2014-08-28: 144.6 mg via INTRAVENOUS

## 2014-08-28 MED ORDER — QUETIAPINE FUMARATE ER 50 MG PO TB24
300.0000 mg | ORAL_TABLET | Freq: Every day | ORAL | Status: DC
  Filled 2014-08-28: qty 1

## 2014-08-28 MED ORDER — LIDOCAINE HCL (CARDIAC) 20 MG/ML IV SOLN
INTRAVENOUS | Status: DC | PRN
  Administered 2014-08-28: 80 mg via INTRAVENOUS

## 2014-08-28 MED ORDER — ASPIRIN EC 81 MG PO TBEC
81.0000 mg | DELAYED_RELEASE_TABLET | Freq: Every day | ORAL | Status: DC
Start: 2014-08-28 — End: 2014-08-29
  Administered 2014-08-28 – 2014-08-29 (×2): 81 mg via ORAL
  Filled 2014-08-28 (×2): qty 1

## 2014-08-28 MED ORDER — IOHEXOL 300 MG/ML  SOLN
INTRAMUSCULAR | Status: DC | PRN
Start: 2014-08-28 — End: 2014-08-28
  Administered 2014-08-28: 90 mL via INTRA_ARTERIAL

## 2014-08-28 MED ORDER — DEXAMETHASONE SODIUM PHOSPHATE 4 MG/ML IJ SOLN
INTRAMUSCULAR | Status: DC | PRN
  Administered 2014-08-28: 10 mg via INTRAVENOUS

## 2014-08-28 MED ORDER — FENTANYL CITRATE (PF) 100 MCG/2ML IJ SOLN
25.0000 ug | INTRAMUSCULAR | Status: DC | PRN
  Administered 2014-08-28 (×4): 25 ug via INTRAVENOUS

## 2014-08-28 MED ORDER — QUETIAPINE FUMARATE ER 50 MG PO TB24
300.0000 mg | ORAL_TABLET | Freq: Every day | ORAL | Status: DC
  Administered 2014-08-28: 300 mg via ORAL

## 2014-08-28 MED ORDER — GLYCOPYRROLATE 0.2 MG/ML IJ SOLN
INTRAMUSCULAR | Status: DC | PRN
  Administered 2014-08-28: 0.2 mg via INTRAVENOUS

## 2014-08-28 MED ORDER — ZOLPIDEM TARTRATE 5 MG PO TABS
5.0000 mg | ORAL_TABLET | Freq: Once | ORAL | Status: AC
  Administered 2014-08-28: 5 mg via ORAL
  Filled 2014-08-28: qty 1

## 2014-08-28 MED ORDER — CARVEDILOL 12.5 MG PO TABS
25.0000 mg | ORAL_TABLET | Freq: Two times a day (BID) | ORAL | Status: DC
  Administered 2014-08-28 – 2014-08-29 (×3): 25 mg via ORAL
  Filled 2014-08-28 (×3): qty 2

## 2014-08-28 MED ORDER — ESMOLOL HCL-SODIUM CHLORIDE 2000 MG/100ML IV SOLN
25.0000 ug/kg/min | INTRAVENOUS | Status: DC
  Filled 2014-08-28: qty 100

## 2014-08-28 MED ORDER — LOSARTAN POTASSIUM 50 MG PO TABS
100.0000 mg | ORAL_TABLET | Freq: Every day | ORAL | Status: DC
  Administered 2014-08-28 – 2014-08-29 (×2): 100 mg via ORAL
  Filled 2014-08-28 (×2): qty 2

## 2014-08-28 MED ORDER — HEPARIN SODIUM (PORCINE) 1000 UNIT/ML IJ SOLN
INTRAMUSCULAR | Status: DC | PRN
  Administered 2014-08-28: 3000 [IU] via INTRAVENOUS
  Administered 2014-08-28: 5000 [IU] via INTRAVENOUS

## 2014-08-28 MED ORDER — PROPOFOL 10 MG/ML IV BOLUS
INTRAVENOUS | Status: DC | PRN
  Administered 2014-08-28: 100 mg via INTRAVENOUS

## 2014-08-28 MED ORDER — FENTANYL CITRATE (PF) 100 MCG/2ML IJ SOLN
INTRAMUSCULAR | Status: DC | PRN
  Administered 2014-08-28 (×2): 100 ug via INTRAVENOUS

## 2014-08-28 MED ORDER — QUETIAPINE FUMARATE 100 MG PO TABS: 300.0000 mg | ORAL_TABLET | Freq: Every day | ORAL | Status: DC

## 2014-08-28 MED ORDER — HYDRALAZINE HCL 20 MG/ML IJ SOLN: 20.0000 mg | Freq: Once | INTRAMUSCULAR | Status: DC

## 2014-08-28 MED ORDER — CLOPIDOGREL BISULFATE 75 MG PO TABS
75.0000 mg | ORAL_TABLET | Freq: Every day | ORAL | Status: DC
  Administered 2014-08-28: 75 mg via ORAL
  Filled 2014-08-28: qty 1

## 2014-08-28 MED ORDER — QUETIAPINE FUMARATE 25 MG PO TABS
100.0000 mg | ORAL_TABLET | Freq: Every day | ORAL | Status: DC
  Administered 2014-08-28 – 2014-08-29 (×2): 100 mg via ORAL
  Filled 2014-08-28 (×2): qty 4

## 2014-08-28 MED ORDER — FOLIC ACID 1 MG PO TABS
1000.0000 ug | ORAL_TABLET | Freq: Every day | ORAL | Status: DC
  Administered 2014-08-28 – 2014-08-29 (×2): 1 mg via ORAL
  Filled 2014-08-28 (×2): qty 1

## 2014-08-28 MED ORDER — MIDAZOLAM HCL 2 MG/2ML IJ SOLN
INTRAMUSCULAR | Status: DC | PRN
  Administered 2014-08-28: 2 mg via INTRAVENOUS

## 2014-08-28 MED ORDER — ROCURONIUM BROMIDE 100 MG/10ML IV SOLN
INTRAVENOUS | Status: DC | PRN
  Administered 2014-08-28: 50 mg via INTRAVENOUS

## 2014-08-28 SURGICAL SUPPLY — 58 items
BAG DECANTER STRL (MISCELLANEOUS) ×3 IMPLANT
BALLN ATG 12X6X80 (BALLOONS) ×3
BALLN ULTRVRSE 8X80X75 (BALLOONS) ×3
BALLOON ATG 12X6X80 (BALLOONS) ×1 IMPLANT
BALLOON ULTRVRSE 8X80X75 (BALLOONS) ×1 IMPLANT
BRUSH SCRUB 4% CHG (MISCELLANEOUS) ×3 IMPLANT
CATH ACCU-VU SIZ PIG 5F 70CM (CATHETERS) ×3 IMPLANT
CATH BALLN CODA 9X100X32 (BALLOONS) ×3 IMPLANT
CATH BEACON 5.038 65CM KMP-01 (CATHETERS) ×3 IMPLANT
CATH CONQUEST 12X4 (BALLOONS) ×3 IMPLANT
DEVICE CLOSURE PERCLS PRGLD 6F (VASCULAR PRODUCTS) ×5 IMPLANT
DEVICE PRESTO INFLATION (MISCELLANEOUS) ×6 IMPLANT
DEVICE TORQUE (MISCELLANEOUS) ×3 IMPLANT
DRYSEAL FLEXSHEATH 12FR 33CM (SHEATH) ×2
DRYSEAL FLEXSHEATH 16FR 33CM (SHEATH) ×2
ELECT CAUTERY BLADE 6.4 (BLADE) ×6 IMPLANT
EXCLUDER TNK LEG 26MX12X14 (Endovascular Graft) ×1 IMPLANT
EXCLUDER TRUNK LEG 26MX12X14 (Endovascular Graft) ×3 IMPLANT
GLIDEWIRE STIFF .35X180X3 HYDR (WIRE) ×3 IMPLANT
GLOVE BIO SURGEON STRL SZ7 (GLOVE) ×3 IMPLANT
GLOVE BIO SURGEON STRL SZ8 (GLOVE) ×3 IMPLANT
GOWN STRL REUS W/ TWL LRG LVL3 (GOWN DISPOSABLE) ×2 IMPLANT
GOWN STRL REUS W/ TWL XL LVL3 (GOWN DISPOSABLE) ×2 IMPLANT
GOWN STRL REUS W/TWL LRG LVL3 (GOWN DISPOSABLE) ×4
GOWN STRL REUS W/TWL XL LVL3 (GOWN DISPOSABLE) ×4
GRAFT EXCLUDER LEG 12X14 (Endovascular Graft) ×3 IMPLANT
GUIDEWIRE PFTE-COATED .018X300 (WIRE) ×3 IMPLANT
IV NS 1000ML (IV SOLUTION) ×2
IV NS 1000ML BAXH (IV SOLUTION) ×1 IMPLANT
LEG CONTRALATERAL 16X18X13.5 (Endovascular Graft) ×3 IMPLANT
LEG CONTRALATERAL 16X20X13.5 (Vascular Products) ×2 IMPLANT
LIQUID BAND (GAUZE/BANDAGES/DRESSINGS) ×6 IMPLANT
LOOP RED MAXI  1X406MM (MISCELLANEOUS) ×8
LOOP VESSEL MAXI 1X406 RED (MISCELLANEOUS) ×4 IMPLANT
LOOP VESSEL MINI 0.8X406 BLUE (MISCELLANEOUS) ×2 IMPLANT
LOOPS BLUE MINI 0.8X406MM (MISCELLANEOUS) ×4
PACK ANGIOGRAPHY (CUSTOM PROCEDURE TRAY) ×3 IMPLANT
PACK BASIN MAJOR ARMC (MISCELLANEOUS) ×3 IMPLANT
PAD GROUND ADULT SPLIT (MISCELLANEOUS) ×6 IMPLANT
PERCLOSE PROGLIDE 6F (VASCULAR PRODUCTS) ×15
SET INTRO CAPELLA COAXIAL (SET/KITS/TRAYS/PACK) ×3 IMPLANT
SHEATH BRITE TIP 6FRX11 (SHEATH) ×6 IMPLANT
SHEATH BRITE TIP 8FRX11 (SHEATH) ×6 IMPLANT
SHEATH DRYSEAL FLEX 12FR 33CM (SHEATH) ×1 IMPLANT
SHEATH DRYSEAL FLEX 16FR 33CM (SHEATH) ×1 IMPLANT
STENT GRAFT CONTRALAT 20X13.5 (Vascular Products) ×1 IMPLANT
SUT MNCRL 4-0 (SUTURE) ×8
SUT MNCRL 4-0 27XMFL (SUTURE) ×4
SUT PROLENE 5 0 RB 1 DA (SUTURE) ×12 IMPLANT
SUT PROLENE 6 0 BV (SUTURE) ×30 IMPLANT
SUTURE MNCRL 4-0 27XMF (SUTURE) ×4 IMPLANT
SYR 20CC LL (SYRINGE) ×6 IMPLANT
SYR MEDRAD MARK V 150ML (SYRINGE) ×3 IMPLANT
TAPE MICROFOAM 3INX51/2YD (TAPE) ×3 IMPLANT
TOWEL OR 17X26 4PK STRL BLUE (TOWEL DISPOSABLE) ×6 IMPLANT
TUBING CONTRAST HIGH PRESS 72 (TUBING) ×3 IMPLANT
WIRE AMPLATZ SSTIFF .035X260CM (WIRE) ×6 IMPLANT
WIRE J 3MM .035X145CM (WIRE) ×6 IMPLANT

## 2014-08-28 NOTE — Anesthesia Procedure Notes (Signed)
Procedure Name: Intubation Date/Time: 08/28/2014 8:10 AM Performed by: Junious Silk Pre-anesthesia Checklist: Patient identified, Patient being monitored, Timeout performed, Emergency Drugs available and Suction available Patient Re-evaluated:Patient Re-evaluated prior to inductionOxygen Delivery Method: Circle System Utilized Preoxygenation: Pre-oxygenation with 100% oxygen Intubation Type: IV induction Ventilation: Mask ventilation without difficulty Laryngoscope Size: Mac and 3 Grade View: Grade I Tube type: Oral Tube size: 7.0 mm Number of attempts: 1 Airway Equipment and Method: Stylet Placement Confirmation: ETT inserted through vocal cords under direct vision,  positive ETCO2 and breath sounds checked- equal and bilateral Secured at: 21 cm Tube secured with: Tape Dental Injury: Teeth and Oropharynx as per pre-operative assessment

## 2014-08-28 NOTE — Op Note (Signed)
OPERATIVE NOTE   PROCEDURE: 1. US guidance for vascular access, bilateral femoral arteries 2. Catheter placement into aorta from bilateral femoral approaches 3. Placement of a C3 Gore Excluder Endoprosthesis main body 26 x 12 x 14 with a 18 x 14 contralateral limb 4. Iliac extender ipsilateral, right side, 20 x 14 stent 5. Iliac extender contralateral, left side, 12 x 14 stent 6. Angiography left lower extremity 7. ProGlide closure devices bilateral femoral arteries  PRE-OPERATIVE DIAGNOSIS: AAA; atherosclerotic occlusive disease bilateral lower extremities with lifestyle limiting claudication  POST-OPERATIVE DIAGNOSIS: same  SURGEON: Leotis Pain, MD and Hortencia Pilar, MD - Co-surgeons  ANESTHESIA: general  ESTIMATED BLOOD LOSS: Minimal cc  FINDING(S): 1.  AAA  SPECIMEN(S):  none  INDICATIONS:   Victoria Mccarty is a 74 y.o. female who presents with abdominal aortic aneurysm which is now greater than 5 cm and requires repair.  DESCRIPTION: After obtaining full informed written consent, the patient was brought back to the operating room and placed supine upon the operating table.  The patient received IV antibiotics prior to induction.  After obtaining adequate anesthesia, the patient was prepped and draped in the standard fashion for endovascular AAA repair.  We then began by gaining access to both femoral arteries with US guidance with me working on the left and Dr. Lucky Cowboy working on the right.  The femoral arteries were found to be patent and accessed without difficulty with a needle under ultrasound guidance without difficulty on each side and permanent images were recorded.  We then placed 2 proglide devices on each side in a pre-close fashion and placed 8 French sheaths. The patient was then given  5000 units of intravenous heparin. The Pigtail catheter was placed into the aorta from the  right side. Using this image, we selected a 26 x 12 x 14 Main body device.  Over a stiff  wire, an 53 French sheath was placed. The main body was then placed through the 18 French sheath. A Kumpe catheter was placed up the left side under fluoroscopic guidance and subsequently a stiff wire was advanced through the Kumpe Kumpe was then removed and the 12 French sheath was advanced under fluoroscopy without difficulty and positioned so that the tip was well within the aneurysmal sac. This was done secondary to the critical stenosis noted at the aortic bifurcation and will avoid jailing the sheath on the left. Subsequently the Kumpe was reintroduced over the wire and a magnified image of the renal arteries was obtained. The main body was then deployed just below the lowest renal artery. The Kumpe catheter was used to cannulate the contralateral gate using a in 018 advantage wire and successful cannulation was confirmed by twirling the pigtail catheter in the main body. We then placed a stiff wire and a retrograde arteriogram was performed through the left femoral sheath.  A 18 x 14 contralateral limb was selected and deployed. The main body deployment was then completed. Based off the angiographic findings, extension limbs were necessary bilaterally.  On the right the overall length to the iliac bifurcation necessitated placement of a 20 x 14 iliac extender and this was done without difficulty. 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 However, whether a chronic dissection is also part of this problem is indeterminant with respect to the CT scan. Therefore in order to treat the external iliac a iliac extender on the  left side is added for treatment of the occlusive disease and possible dissection. Given these findings and additional 2000 units of heparin was also given at this point. After retrograde imaging and 12 x 14 limb is selected advanced under fluoroscopy and once appropriately positioned deployed. The imaging of the stent graft under fluoroscopy revealed 2 separate issues there is a severe constraint of the main body at the sharp angulation noted within the aortic neck secondarily there is a severe constraint of the limbs of the stent at the level of the aortic bifurcation and the common iliac artery origins. For these 2 reasons a 12 x 4 and a 12 x 6 balloon was selected and they were advanced over the wire up to the proximal portion of the main body and inflated simultaneously using a kissing balloon technique inflation was to 12-14 atm for approximately 30-40 seconds. The balloons were then repositioned at the aortic aortic bifurcation and again using kissing balloon technique simultaneous inflation at this level was performed. Follow-up imaging now revealed that the aneurysm stent graft was fully expanded at both locations. The 12 mm balloon was then continued on the left side down to angioplasty the entire external iliac which is being covered by the stent treating the pre-existing occlusive disease within the external iliac.  All junction points and seals zones were really treated with the compliant balloon. The pigtail catheter was then replaced and a completion angiogram was performed.   No Endoleak was detected on completion angiography. The renal arteries were found to be widely patent. Given the findings on the left and the occlusive disease completion angiography of the left lower extremity was then performed through the existing sheath this demonstrated some narrowing of the origin of the profunda as well as chronic occlusion of the SFA. There appears to be reconstitution of the SFA at the above-knee  popliteal, Hunter's canal. . At this point we elected to terminate the procedure. We secured the pro glide devices for hemostasis on the femoral arteries. The skin incision was closed with a 4-0 Monocryl. Dermabond and pressure dressing were placed. The patient was taken to the recovery room in stable condition having tolerated the procedure well.  COMPLICATIONS: none  CONDITION: stable  Katha Cabal  08/28/2014, 10:10 AM

## 2014-08-28 NOTE — Progress Notes (Signed)
Pt c/o anxiety and restlessness. Pt is emotional and crying worried about am procedure. PA notified and orders received for Ambien  x 1 for sleep and to resume Night time psych med seroquel. Will continue to monitor.

## 2014-08-28 NOTE — Progress Notes (Signed)
   08/28/14 1315  Vitals  BP (!) 200/81 mmHg  MAP (mmHg) 111  BP Location Right Arm  BP Method Automatic  Patient Position (if appropriate) Lying  Pulse Rate 61  Pulse Rate Source Monitor  ECG Heart Rate 62  Cardiac Rhythm NSR  Resp 14  Oxygen Therapy  SpO2 100 %  O2 Device Nasal Cannula  O2 Flow Rate (L/min) 2 L/min  Pulse Oximetry Type Continuous  Pain Assessment  Pain Score Asleep  titratable gtt infusing, nursing will cont monitor

## 2014-08-28 NOTE — Op Note (Addendum)
OPERATIVE NOTE   PROCEDURE: 1. US guidance for vascular access, bilateral femoral arteries 2. Catheter placement into aorta from bilateral femoral approaches 3. Placement of a 26mm x 12mm x 14cm Gore Excluder Endoprosthesis main body right with a 18mm x 14 cm contralateral limb 4. Placement of a 12mm x 14 cm left iliac extender 5. Placement of a 20mm x 14 cm right iliac extender 6. ProGlide closure devices bilateral femoral arteries 7. Angiography of the left lower extremity  PRE-OPERATIVE DIAGNOSIS: AAA, PAD of both LE with lifestyle limiting claudication  POST-OPERATIVE DIAGNOSIS: same  SURGEON: Festus Barren, MD and Levora Dredge, MD - Co-surgeons  ANESTHESIA: general  ESTIMATED BLOOD LOSS: minimal  FINDING(S): 1.  AAA  SPECIMEN(S):  none  INDICATIONS:   Victoria Mccarty is a 74 y.o. 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  She also has significant iliac occlusive disease and infrainguinal disease.  She is brought in for repair of her aneurysm in an endovascular fashion. Her preoperative CT scan was not with endograft protocol and was suboptimal in quality.  DESCRIPTION: After obtaining full informed written consent, the patient was brought back to the operating room and placed supine upon the operating table.  The patient received IV antibiotics prior to induction.  After obtaining adequate anesthesia, the patient was prepped and draped in the standard fashion for endovascular AAA repair.  We then began by gaining access to both femoral arteries with US guidance with me working on the right and Dr. Gilda Crease working on the left.  The femoral arteries were found to be patent but diseased and accessed without difficulty with a needle under ultrasound guidance without difficulty on each side and permanent images were recorded.  We then placed 2 proglide devices on each side in a pre-close fashion and placed 8 French sheaths. The patient was then given  5000 units of intravenous heparin.  The Pigtail catheter was placed into the aorta from the right side. Using this image, we selected a 26mm diameter proximal and 14 cm length Main body device.  Over a stiff wire, an 58 French sheath was placed. The main body was then placed through the 18 French sheath. A Kumpe catheter was placed up the left side and a magnified image at the renal arteries was performed. The main body was then deployed just below the lowest renal artery. The Kumpe catheter was used to cannulate the contralateral gate without difficulty and successful cannulation was confirmed by twirling the pigtail catheter in the main body. We then placed a stiff wire and a retrograde arteriogram was performed through the left femoral sheath. 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 We upsized to the 12 [place] sheath for the contralateral limb and a 18mm x 14 cm limb was selected and deployed. This terminated in the distal common iliac artery. We then took a 12 mm diameter by 14 cm length limb and extended this into the distal external iliac artery. Noncompliant 8 mm balloon was used distally. The main body deployment was then completed. Based off the angiographic findings, extension limbs were necessary on the right side.  A 20 mm diameter by 14 cm length extension limb was deployed just above the hypogastric artery on the right. There was a constrained area at the aortic bifurcation as well as at the  flow divider that we elected to treat with kissing balloon 12 mm balloons in both locations from each side. This demonstrated a brisk flow through the stent graft no significant residual narrowing in either location on each side . All junction points and seals zones were treated with the compliant balloon. The pigtail catheter was then replaced and a completion  angiogram was performed.   No Endoleak was detected on completion angiography. The renal arteries were found to be widely patent. The right hypogastric artery was patent. The left hypogastric artery had to be covered to treat the severe external iliac artery occlusive disease. We elected to perform imaging down the left leg to make sure that her flow was adequate. She was found to have a chronic SFA occlusion that reconstituted at Hunter's canal through a reasonably normal profunda femoris artery.. At this point we elected to terminate the procedure. We secured the pro glide devices for hemostasis on the femoral arteries. The skin incision was closed with a 4-0 Monocryl. Dermabond and pressure dressing were placed. The patient was taken to the recovery room in stable condition having tolerated the procedure well.  COMPLICATIONS: none  CONDITION: stable  DEW,JASON  08/28/2014, 10:08 AM

## 2014-08-28 NOTE — Anesthesia Preprocedure Evaluation (Addendum)
Anesthesia Evaluation  Patient identified by MRN, date of birth, ID band Patient awake    Reviewed: Allergy & Precautions, H&P , NPO status , Patient's Chart, lab work & pertinent test results, reviewed documented beta blocker date and time   History of Anesthesia Complications Negative for: history of anesthetic complications  Airway Mallampati: II  TM Distance: >3 FB Neck ROM: Full    Dental  (+) Upper Dentures   Pulmonary shortness of breath, Current Smoker,    Pulmonary exam normal       Cardiovascular hypertension, - angina+ CAD, + Past MI, + CABG and + Peripheral Vascular Disease Normal cardiovascular exam- Valvular Problems/Murmurs    Neuro/Psych PSYCHIATRIC DISORDERS (depression and bipolar) Anxiety Depression Bipolar Disorder TIACVA, No Residual Symptoms    GI/Hepatic negative GI ROS, Neg liver ROS,   Endo/Other  negative endocrine ROS  Renal/GU Renal diseaseKidney cysts  negative genitourinary   Musculoskeletal  (+) Arthritis -, Osteoarthritis,    Abdominal Normal abdominal exam  (+)   Peds negative pediatric ROS (+)  Hematology negative hematology ROS (+)   Anesthesia Other Findings Past Medical History:   Coronary artery disease                                      Hypertension                                                 Depression                                                   Osteoarthritis                                               Low back pain                                                Hyperlipemia                                                 Myocardial infarction                                        Stroke                                                         Comment:3 years ago no residual effect   TIA (transient ischemic attack)  SOB (shortness of breath) on exertion                        Kidney cysts                                                    Comment:Right   Bipolar 1 disorder                                           Psychosis                                                    Reproductive/Obstetrics negative OB ROS                            Anesthesia Physical  Anesthesia Plan  ASA: III  Anesthesia Plan: General   Post-op Pain Management:    Induction: Intravenous  Airway Management Planned: Oral ETT  Additional Equipment:   Intra-op Plan:   Post-operative Plan: Extubation in OR  Informed Consent: I have reviewed the patients History and Physical, chart, labs and discussed the procedure including the risks, benefits and alternatives for the proposed anesthesia with the patient or authorized representative who has indicated his/her understanding and acceptance.   Dental advisory given  Plan Discussed with: CRNA, Surgeon and Anesthesiologist  Anesthesia Plan Comments:        Anesthesia Quick Evaluation

## 2014-08-28 NOTE — OR Nursing (Signed)
Bilateral groin sites dry and intact no bleeding, foley intact with clear yellow urine.  Attempted to have vascular tech document sites and foley at insertion with no avail.  Or nurse also stated unable to chart in vascular lab although RN did indeed insert foley prior to case.  Matter discussed with Enid Derry for legal clarification.

## 2014-08-28 NOTE — Progress Notes (Signed)
Patient seems to be resting comfortably after her procedure today. No significant nausea or vomiting. Appropriate groin and back pain after her aneurysm repair and stenting of the left external iliac artery. Feet are warm with good capillary refill. No foot pain. Hypertension is present which is there to baseline. She is currently on some esmolol and we have restarted her oral antihypertensives. No groin hematomas. No signs of bleeding. Check labs in the morning and monitor in the critical care unit.

## 2014-08-28 NOTE — Transfer of Care (Signed)
Immediate Anesthesia Transfer of Care Note  Patient: Victoria Mccarty  Procedure(s) Performed: Procedure(s): Endovascular Repair/Stent Graft (Bilateral)  Patient Location: PACU  Anesthesia Type:General  Level of Consciousness: awake and oriented  Airway & Oxygen Therapy: Patient Spontanous Breathing and Patient connected to face mask oxygen  Post-op Assessment: Report given to RN and Post -op Vital signs reviewed and stable  Post vital signs: Reviewed and stable  Last Vitals:  Filed Vitals:   08/28/14 1031  BP: 175/94  Pulse:   Temp: 36.2 C  Resp: 17    Complications: No apparent anesthesia complications

## 2014-08-29 LAB — TYPE AND SCREEN
ABO/RH(D): O POS
Antibody Screen: POSITIVE
PT AG TYPE: POSITIVE
Unit division: 0
Unit division: 0

## 2014-08-29 MED ORDER — HYDROCODONE-ACETAMINOPHEN 5-325 MG PO TABS: ORAL_TABLET | ORAL | Status: DC

## 2014-08-29 MED ORDER — ACETAMINOPHEN 325 MG PO TABS: ORAL_TABLET | ORAL | Status: AC

## 2014-08-29 NOTE — Care Management Important Message (Signed)
Important Message  Patient Details  Name: Victoria Mccarty MRN: [id number] Date of Birth: 09-29-1940   Medicare Important Message Given:  Yes-third notification given    Olegario Messier A Allmond 08/29/2014, 9:40 AM

## 2014-08-29 NOTE — Progress Notes (Signed)
[facility]  Daily Progress Note   Subjective:   1 Day Post-Op: Endovascular AAA repair with pacement of a 26mm x 12mm x 14cm Gore Excluder Endoprosthesis main body right with a 18mm x 14 cm contralateral limb, placement of a 12mm x 14 cm left iliac extender, placement of a 20mm x 14 cm right iliac extender.  Patient without complaint sitting comfortably in chair. Foley removed this AM. Tolerating a regular diet. Ambulating without issue. Some incisional discomfort but controlled through PO medication.   Objective: Filed Vitals:   08/29/14 0600 08/29/14 0700 08/29/14 0720 08/29/14 0800  BP: 119/98 128/51 139/56 126/50  Pulse: 67 69  55  Temp:   97.7 F (36.5 C)   TempSrc:   Oral   Resp: Height:      Weight:      SpO2: 99% 97% 96% 91%    Intake/Output Summary (Last 24 hours) at 08/29/14 0936 Last data filed at 08/29/14 0900  Gross per 24 hour  Intake 3122.05 ml  Output   2400 ml  Net 722.05 ml    Physical Exam: A&Ox3, NAD CV: RRR Pulmonary: CTA Bilaterally Abdomen: Soft, Nontender, Nondistended GU: foley removed Vascular: Left Extremity: Groin access site with dermabond intact, no swelling, no draining, thigh soft, calf soft, feet / toes warm. Right Extremity: Groin access site with dermabond intact, no swelling, no draining, thigh soft, calf soft, feet / toes warm.  Laboratory: CBC    Component Value Date/Time   WBC 8.6 08/28/2014 0726   WBC 8.4 08/27/2011 0419   HGB 14.3 08/28/2014 0726   HGB 13.6 08/27/2011 0419   HCT 41.4 08/28/2014 0726   HCT 40.9 08/27/2011 0419   PLT 150 08/28/2014 0726   PLT 154 08/27/2011 0419    BMET    Component Value Date/Time   NA 141 08/28/2014 0726   NA 143 08/27/2011 0419   K 4.0 08/28/2014 0726   K 3.5 08/27/2011 0419   CL 108 08/28/2014 0726   CL 108* 08/27/2011 0419   CO2 25 08/28/2014 0726   CO2 25 08/27/2011 0419   GLUCOSE 102* 08/28/2014 0726   GLUCOSE 94 08/27/2011 0419   BUN  12 08/28/2014 0726   BUN 15 10/06/2011 0828   CREATININE 0.93 08/28/2014 0726   CREATININE 0.89 10/06/2011 0828   CALCIUM 9.1 08/28/2014 0726   CALCIUM 8.7 08/27/2011 0419   GFRNONAA 60* 08/28/2014 0726   GFRNONAA >60 10/06/2011 0828   GFRAA >60 08/28/2014 0726   GFRAA >60 10/06/2011 1610    Assessment/Planning: 74 year old female s/p endovascular AAA repair - POD #1, doing well 1) once patient passes trial of OK to d/c home.  Cleda Daub PA-C 08/29/2014 9:36 AM

## 2014-08-29 NOTE — Anesthesia Postprocedure Evaluation (Signed)
  Anesthesia Post-op Note  Patient: Victoria Mccarty  Procedure(s) Performed: Procedure(s): Endovascular Repair/Stent Graft (Bilateral)  Anesthesia type:General  Patient location: CCU 5  Post pain: Pain level controlled  Post assessment: Post-op Vital signs reviewed, Patient's Cardiovascular Status Stable, Respiratory Function Stable, Patent Airway and No signs of Nausea or vomiting  Post vital signs: Reviewed and stable  Last Vitals:  Filed Vitals:   08/29/14 0720  BP: 139/56  Pulse:   Temp: 36.5 C  Resp:     Level of consciousness: awake, alert  and patient cooperative  Complications: No apparent anesthesia complications

## 2014-08-29 NOTE — Discharge Instructions (Signed)
You may shower as of tomorrow. No driving while on pain medication.  Abdominal Aortic Aneurysm Open Repair, Care After Refer to this sheet in the next few weeks. These instructions provide you with information on caring for yourself after your procedure. Your health care provider may also give you more specific instructions. Your treatment has been planned according to current medical practices, but problems sometimes occur. Call your health care provider if you have any problems or questions after your procedure.  WHAT TO EXPECT AFTER THE PROCEDURE  After your procedure, it is typical to have the following sensations:  Pain at the incision site. Pain-relieving medicine will be given to control this.  Increased tiredness. It can take up to 3 months before you resume all your normal activities. HOME CARE INSTRUCTIONS   Get plenty of rest, but move around frequently for short periods or take short walks as directed by your health care provider. Gradually increase the distance you walk.   Keep the incision area clean and dry. Remove or change bandages (dressings) only as directed by your health care provider. You may have skin adhesive strips over the incision area. Do not take the strips off. They will fall off on their own.   Take showers once your health care provider approves. Until then, only take sponge baths. Pat incisions dry. Do not rub incisions with a washcloth or towel. Do not take tub baths or go swimming until your health care provider approves.   Check your incision area every day for increased pain, swelling, redness, or fluid leaking from the incision. These may be signs of an infection.   Only take over-the-counter or prescription medicines as directed by your health care provider.   Limit activities as directed by your health care provider. Avoid strenuous activity and heavy lifting for 6-8 weeks.   Do not drive until your health care provider approves.   Drink  enough fluids to keep your urine clear or pale yellow.   Make any lifestyle changes recommended by your health care provider. This may include:   Quitting smoking.   Managing your blood pressure.   Reducing stress.   Eating healthy foods that are good for your heart.   Getting regular exercise.   Follow up with your health care provider as directed.  SEEK MEDICAL CARE IF:   You have increasing pain.   You have a fever.   You have chills.  You develop redness, swelling, increased pain, or drainage in the incision area.   You notice a bad smell coming from the incision area or dressing.   You notice that the edges of the incision are not staying together after the stitches or staples have been removed.   You have persistent nausea or vomiting.  You develop a rash. SEEK IMMEDIATE MEDICAL CARE IF:   You have dizziness or fainting while standing.   You have difficulty breathing. Document Released: 07/16/2004 Document Revised: 08/29/2012 Document Reviewed: 06/05/2012 [facility] Patient Information 2015 [place], [place]. This information is not intended to replace advice given to you by your health care provider. Make sure you discuss any questions you have with your health care provider.

## 2014-08-29 NOTE — Discharge Summary (Signed)
[facility]    Discharge Summary    Patient ID:  Victoria Mccarty MRN: [id number] DOB/AGE: [birth year] 73 y.o.  Admit date: 08/27/2014 Discharge date: 08/29/2014 Date of Surgery: 08/27/2014 - 08/28/2014 Surgeon: Surgeon(s): Annice Needy, MD Renford Dills, MD  Admission Diagnosis: AAA GORE REP Dr Gilda Crease Will Assist anurysm  Discharge Diagnoses:  AAA GORE REP Dr Gilda Crease Will Assist anurysm  Secondary Diagnoses: Past Medical History  Diagnosis Date  . Coronary artery disease   . Hypertension   . Depression   . Osteoarthritis   . Low back pain   . Hyperlipemia   . Myocardial infarction   . Stroke     3 years ago no residual effect  . TIA (transient ischemic attack)   . SOB (shortness of breath) on exertion   . Kidney cysts     Right  . Bipolar 1 disorder   . Psychosis     Procedure(s): Endovascular Repair/Stent Graft  Discharged Condition: good  HPI:  Victoria Mccarty is a 74 y.o. 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 She also has significant iliac occlusive disease and infrainguinal disease.  Hospital Course:  On 08/28/14, the patient underwent an: 1. US guidance for vascular access, bilateral femoral arteries 2. Catheter placement into aorta from bilateral femoral approaches 3. Placement of a 26mm x 12mm x 14cm Gore Excluder Endoprosthesis main body right with a 18mm x 14 cm contralateral limb 4. Placement of a 12mm x 14 cm left iliac extender 5. Placement of a 20mm x 14 cm right iliac extender 6. ProGlide closure devices bilateral femoral arteries 7. Angiography of the left lower extremity  The patient tolerated the procedure well. She was transferred from the PACU to ICU for close observation without issue. During her brief hospital stay, the patient's foley was removed, her diet was advanced and she was ambulating without any complications.   Victoria Mccarty is a 74 y.o. female is S/P:  Procedure(s): Endovascular Repair/Stent  Graft  Extubated: POD # 0  Physical exam:  A&Ox3, NAD CV: RRR Pulmonary: CTA Bilaterally Abdomen: Soft, Nontender, Nondistended GU: foley removed Vascular: Left Extremity: Groin access site with dermabond intact, no swelling, no draining, thigh soft, calf soft, feet / toes warm. Right Extremity: Groin access site with dermabond intact, no swelling, no draining, thigh soft, calf soft, feet / toes warm.  Post-op wounds clean, dry, intact or healing well Pt. Ambulating, voiding and taking PO diet without difficulty. Pt pain controlled with PO pain meds. Labs as below Complications:none  Consults:   None  Significant Diagnostic Studies: CBC Lab Results  Component Value Date   WBC 8.6 08/28/2014   HGB 14.3 08/28/2014   HCT 41.4 08/28/2014   MCV 93.6 08/28/2014   PLT 150 08/28/2014   BMET    Component Value Date/Time   NA 141 08/28/2014 0726   NA 143 08/27/2011 0419   K 4.0 08/28/2014 0726   K 3.5 08/27/2011 0419   CL 108 08/28/2014 0726   CL 108* 08/27/2011 0419   CO2 25 08/28/2014 0726   CO2 25 08/27/2011 0419   GLUCOSE 102* 08/28/2014 0726   GLUCOSE 94 08/27/2011 0419   BUN 12 08/28/2014 0726   BUN 15 10/06/2011 0828   CREATININE 0.93 08/28/2014 0726   CREATININE 0.89 10/06/2011 0828   CALCIUM 9.1 08/28/2014 0726   CALCIUM 8.7 08/27/2011 0419   GFRNONAA 60* 08/28/2014 0726   GFRNONAA >60 10/06/2011 0828   GFRAA >60 08/28/2014  1610   GFRAA >60 10/06/2011 0828   COAG Lab Results  Component Value Date   INR 1.03 08/21/2014   INR 1.00 08/06/2014   INR 1.1 08/29/2011   Disposition:  Discharge to :Home    Medication List    TAKE these medications        acetaminophen 325 MG tablet  Commonly known as:  TYLENOL  One to two tabs by mouth every six hours as needed for mild pain     aspirin 81 MG EC tablet  Take 1 tablet (81 mg total) by mouth daily.     atorvastatin 20 MG tablet  Commonly known as:  LIPITOR  Take 20 mg by mouth daily.      carvedilol 25 MG tablet  Commonly known as:  COREG  Take 25 mg by mouth 2 (two) times daily.     cholecalciferol 1000 UNITS tablet  Commonly known as:  VITAMIN D  Take 1,000 Units by mouth daily.     clopidogrel 75 MG tablet  Commonly known as:  PLAVIX  Take 75 mg by mouth at bedtime.     folic acid 800 MCG tablet  Commonly known as:  FOLVITE  Take 800 mcg by mouth daily.     HYDROcodone-acetaminophen 5-325 MG per tablet  Commonly known as:  NORCO/VICODIN  One tab every six hours as needed for pain     KRILL OIL PO  Take 1 capsule by mouth daily.     losartan 100 MG tablet  Commonly known as:  COZAAR  Take 100 mg by mouth daily.     QUEtiapine 300 MG 24 hr tablet  Commonly known as:  SEROQUEL XR  Take 300 mg by mouth at bedtime.     QUEtiapine 100 MG tablet  Commonly known as:  SEROQUEL  Take 100 mg by mouth daily.     VITAMIN B COMPLEX PO  Take 1 tablet by mouth daily.       Verbal and written Discharge instructions given to the patient. Wound care per Discharge AVS     Follow-up Information    Follow up with DEW,JASON, MD In 4 weeks.   Specialties:  Vascular Surgery, Radiology, Interventional Cardiology   Why:  [facility] and [facility]   Contact information:   [address] [place] [address] [phone]       Signed: Tonette Lederer, PA-C  08/29/2014, 10:14 AM

## 2014-08-30 LAB — MRSA CULTURE

## 2014-09-03 NOTE — Addendum Note (Signed)
Addendum  created 09/03/14 1506 by Junious Silk, CRNA   Modules edited: Anesthesia Responsible Staff

## 2014-09-16 ENCOUNTER — Encounter: Payer: Self-pay | Admitting: Urology

## 2014-09-16 ENCOUNTER — Ambulatory Visit (INDEPENDENT_AMBULATORY_CARE_PROVIDER_SITE_OTHER): Payer: Medicare Other | Admitting: Urology

## 2014-09-16 VITALS — BP 115/63 | HR 74 | Ht 68.0 in | Wt 153.4 lb

## 2014-09-16 DIAGNOSIS — Q6100 Congenital renal cyst, unspecified: Secondary | ICD-10-CM

## 2014-09-16 DIAGNOSIS — N281 Cyst of kidney, acquired: Secondary | ICD-10-CM

## 2014-09-16 DIAGNOSIS — I10 Essential (primary) hypertension: Secondary | ICD-10-CM | POA: Insufficient documentation

## 2014-09-16 DIAGNOSIS — E7800 Pure hypercholesterolemia, unspecified: Secondary | ICD-10-CM | POA: Insufficient documentation

## 2014-09-16 NOTE — Progress Notes (Signed)
09/16/2014 12:13 PM   Victoria Mccarty 1940/07/08 [id number]  Referring provider: Lauro Regulus, MD [address] [place], [place] [address]  Chief Complaint  Patient presents with  . Renal Cyst    New Patient    HPI: History of previous surgery for right-sided large cyst at [facility]. Cystoscopy removed surgically and the area packed with fat. 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 Greater concern with the aneurysms which were treated immediately after this CAT scan. These aneurysms were successfully treated. Patient had had a TIA while driving in CT scan revealed the aneurysm and she was treated expediently right away she's having no pain or bleeding from the spleen I plan to recheck it in 6 months. Discussed with the patient at length. Addition of this she is now uncooperative gel and aspirin. She soon to stop her aspirin and she bruises very easily. She continues to smoke heavily he has been advised to course to stop    PMH: Past Medical History  Diagnosis Date  . Coronary artery disease   . Hypertension   . Depression   . Osteoarthritis   . Low back pain   . Hyperlipemia   . Myocardial infarction   . Stroke     3 years ago no residual effect  . TIA (transient ischemic attack)   . SOB (shortness of breath) on exertion   . Kidney cysts     Right  . Bipolar 1 disorder   . Psychosis     Surgical History: Past Surgical History  Procedure Laterality Date  . Cardiac surgery    . Abdominal hysterectomy    . Subclavian stent placement Left   . Tumor resection kidney Right   . Coronary artery bypass graft      6 vessels 8 years ago  . Blepharoplasty Bilateral   . Peripheral vascular catheterization Bilateral 08/28/2014    Procedure: Endovascular Repair/Stent Graft;  Surgeon: Annice Needy, MD;  Location: [facility] INVASIVE CV LAB;  Service: Cardiovascular;  Laterality: Bilateral;    Home Medications:    Medication List       This list  is accurate as of: 09/16/14 12:13 PM.  Always use your most recent med list.               acetaminophen 325 MG tablet  Commonly known as:  TYLENOL  One to two tabs by mouth every six hours as needed for mild pain     aspirin 81 MG EC tablet  Take 1 tablet (81 mg total) by mouth daily.     atorvastatin 20 MG tablet  Commonly known as:  LIPITOR  Take 20 mg by mouth daily.     carvedilol 25 MG tablet  Commonly known as:  COREG  Take 25 mg by mouth 2 (two) times daily.     cholecalciferol 1000 UNITS tablet  Commonly known as:  VITAMIN D  Take 1,000 Units by mouth daily.     clopidogrel 75 MG tablet  Commonly known as:  PLAVIX  Take 75 mg by mouth at bedtime.     folic acid 800 MCG tablet  Commonly known as:  FOLVITE  Take 800 mcg by mouth daily.     KRILL OIL PO  Take 1 capsule by mouth daily.     losartan 100 MG tablet  Commonly known as:  COZAAR  Take 100 mg by mouth daily.     nitroGLYCERIN 0.4 MG  SL tablet  Commonly known as:  NITROSTAT  Place under the tongue.     QUEtiapine 300 MG 24 hr tablet  Commonly known as:  SEROQUEL XR  Take 300 mg by mouth at bedtime.     QUEtiapine 100 MG tablet  Commonly known as:  SEROQUEL  Take 100 mg by mouth daily.     VITAMIN B COMPLEX PO  Take 1 tablet by mouth daily.        Allergies: No Known Allergies  Family History: Family History  Problem Relation Age of Onset  . CVA    . CAD    . Hematuria Sister     Social History:  reports that she has been smoking.  She does not have any smokeless tobacco history on file. She reports that she drinks about 1.2 oz of alcohol per week. She reports that she does not use illicit drugs.  ROS: UROLOGY Frequent Urination?: No Hard to postpone urination?: No Burning/pain with urination?: No Get up at night to urinate?: Yes Leakage of urine?: Yes Urine stream starts and stops?: No Trouble starting stream?: No Do you have to strain to urinate?: No Blood in urine?:  No Urinary tract infection?: No Sexually transmitted disease?: No Injury to kidneys or bladder?: No Painful intercourse?: No Weak stream?: No Currently pregnant?: No Vaginal bleeding?: No Last menstrual period?: n  Gastrointestinal Nausea?: No Vomiting?: No Indigestion/heartburn?: Yes Diarrhea?: Yes Constipation?: Yes  Constitutional Fever: No Night sweats?: No Weight loss?: No Fatigue?: No  Skin Skin rash/lesions?: No Itching?: No  Eyes Blurred vision?: No Double vision?: No  Ears/Nose/Throat Sore throat?: No Sinus problems?: No  Hematologic/Lymphatic Swollen glands?: No Easy bruising?: Yes  Cardiovascular Leg swelling?: No Chest pain?: No  Respiratory Cough?: Yes Shortness of breath?: Yes  Endocrine Excessive thirst?: No  Musculoskeletal Back pain?: Yes Joint pain?: No  Neurological Headaches?: No Dizziness?: Yes  Psychologic Depression?: Yes Anxiety?: Yes  Physical Exam: BP 115/63 mmHg  Pulse 74  Ht 5\' 8"  (1.727 m)  Wt 153 lb 6.4 oz (69.582 kg)  BMI 23.33 kg/m2  Constitutional:  Alert and oriented, No acute distress. HEENT: [place] AT, moist mucus membranes.  Trachea midline, no masses. Cardiovascular: No clubbing, cyanosis, or edema. Respiratory: Normal respiratory effort, no increased work of breathing. GI: Abdomen is soft, nontender, nondistended, no abdominal masses GU: No CVA tenderness. 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 Lymph: No cervical or inguinal adenopathy. Neurologic: Grossly intact, no focal deficits, moving all 4 extremities. Psychiatric: Normal mood and affect.  Laboratory Data: Lab Results  Component Value Date   WBC 8.6 08/28/2014   HGB 14.3 08/28/2014   HCT 41.4 08/28/2014   MCV 93.6 08/28/2014   PLT 150 08/28/2014    Lab Results  Component Value Date   CREATININE 0.93 08/28/2014    No results found for: PSA  No results found  for: TESTOSTERONE  No results found for: HGBA1C  Urinalysis    Component Value Date/Time   COLORURINE YELLOW* 08/07/2014 1135   COLORURINE Yellow 08/26/2011 1413   APPEARANCEUR CLEAR* 08/07/2014 1135   APPEARANCEUR Hazy 08/26/2011 1413   LABSPEC 1.008 08/07/2014 1135   LABSPEC 1.012 08/26/2011 1413   PHURINE 6.0 08/07/2014 1135   PHURINE 6.0 08/26/2011 1413   GLUCOSEU NEGATIVE 08/07/2014 1135   GLUCOSEU Negative 08/26/2011 1413   HGBUR NEGATIVE 08/07/2014 1135   HGBUR Negative 08/26/2011 1413   BILIRUBINUR NEGATIVE 08/07/2014 1135  BILIRUBINUR Negative 08/26/2011 1413   KETONESUR NEGATIVE 08/07/2014 1135   KETONESUR Negative 08/26/2011 1413   PROTEINUR NEGATIVE 08/07/2014 1135   PROTEINUR 30 mg/dL 16/10/9602 5409   NITRITE NEGATIVE 08/07/2014 1135   NITRITE Negative 08/26/2011 1413   LEUKOCYTESUR NEGATIVE 08/07/2014 1135   LEUKOCYTESUR Negative 08/26/2011 1413    Pertinent Imaging: Reviewed CT scan with patient.  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 Status postop iliac and aortic aneurysm rate there with a sleeve inserted the by of the femoral artery. Extensive atherosclerotic disease in a patient is an active smoker. Plan redo CT scan in 6 months. Reviewed scan with Dr. Berneice Heinrich both the report and the scan itself both of [place] felt that 6 months would be a appropriate time for review of this Bosniak type III lesions.  1. Renal cyst Bosniak III - Urinalysis, Complete   No Follow-up on file.  Lorraine Lax, MD  [facility] [address] [place], [place] [address] [phone]

## 2015-01-23 DIAGNOSIS — E785 Hyperlipidemia, unspecified: Secondary | ICD-10-CM | POA: Diagnosis not present

## 2015-01-23 DIAGNOSIS — I714 Abdominal aortic aneurysm, without rupture: Secondary | ICD-10-CM | POA: Diagnosis not present

## 2015-01-23 DIAGNOSIS — I10 Essential (primary) hypertension: Secondary | ICD-10-CM | POA: Diagnosis not present

## 2015-01-23 DIAGNOSIS — F17200 Nicotine dependence, unspecified, uncomplicated: Secondary | ICD-10-CM | POA: Diagnosis not present

## 2015-01-23 DIAGNOSIS — I70213 Atherosclerosis of native arteries of extremities with intermittent claudication, bilateral legs: Secondary | ICD-10-CM | POA: Diagnosis not present

## 2015-01-23 DIAGNOSIS — I739 Peripheral vascular disease, unspecified: Secondary | ICD-10-CM | POA: Diagnosis not present

## 2015-01-26 DIAGNOSIS — I2510 Atherosclerotic heart disease of native coronary artery without angina pectoris: Secondary | ICD-10-CM | POA: Diagnosis not present

## 2015-01-26 DIAGNOSIS — I10 Essential (primary) hypertension: Secondary | ICD-10-CM | POA: Diagnosis not present

## 2015-01-26 DIAGNOSIS — J42 Unspecified chronic bronchitis: Secondary | ICD-10-CM | POA: Diagnosis not present

## 2015-01-26 DIAGNOSIS — F325 Major depressive disorder, single episode, in full remission: Secondary | ICD-10-CM | POA: Diagnosis not present

## 2015-01-26 DIAGNOSIS — E7800 Pure hypercholesterolemia, unspecified: Secondary | ICD-10-CM | POA: Diagnosis not present

## 2015-02-23 DIAGNOSIS — I2510 Atherosclerotic heart disease of native coronary artery without angina pectoris: Secondary | ICD-10-CM | POA: Diagnosis not present

## 2015-02-23 DIAGNOSIS — I639 Cerebral infarction, unspecified: Secondary | ICD-10-CM | POA: Diagnosis not present

## 2015-02-23 DIAGNOSIS — Z01818 Encounter for other preprocedural examination: Secondary | ICD-10-CM | POA: Diagnosis not present

## 2015-02-23 DIAGNOSIS — Z8673 Personal history of transient ischemic attack (TIA), and cerebral infarction without residual deficits: Secondary | ICD-10-CM | POA: Diagnosis not present

## 2015-02-23 DIAGNOSIS — Z951 Presence of aortocoronary bypass graft: Secondary | ICD-10-CM | POA: Diagnosis not present

## 2015-02-23 DIAGNOSIS — J449 Chronic obstructive pulmonary disease, unspecified: Secondary | ICD-10-CM | POA: Diagnosis not present

## 2015-02-23 DIAGNOSIS — R011 Cardiac murmur, unspecified: Secondary | ICD-10-CM | POA: Diagnosis not present

## 2015-02-23 DIAGNOSIS — F17200 Nicotine dependence, unspecified, uncomplicated: Secondary | ICD-10-CM | POA: Diagnosis not present

## 2015-02-23 DIAGNOSIS — R0602 Shortness of breath: Secondary | ICD-10-CM | POA: Diagnosis not present

## 2015-02-26 DIAGNOSIS — I739 Peripheral vascular disease, unspecified: Secondary | ICD-10-CM | POA: Diagnosis not present

## 2015-02-26 DIAGNOSIS — E785 Hyperlipidemia, unspecified: Secondary | ICD-10-CM | POA: Diagnosis not present

## 2015-02-26 DIAGNOSIS — I70213 Atherosclerosis of native arteries of extremities with intermittent claudication, bilateral legs: Secondary | ICD-10-CM | POA: Diagnosis not present

## 2015-02-26 DIAGNOSIS — F17200 Nicotine dependence, unspecified, uncomplicated: Secondary | ICD-10-CM | POA: Diagnosis not present

## 2015-02-26 DIAGNOSIS — I6523 Occlusion and stenosis of bilateral carotid arteries: Secondary | ICD-10-CM | POA: Diagnosis not present

## 2015-02-26 DIAGNOSIS — I10 Essential (primary) hypertension: Secondary | ICD-10-CM | POA: Diagnosis not present

## 2015-02-26 DIAGNOSIS — I714 Abdominal aortic aneurysm, without rupture: Secondary | ICD-10-CM | POA: Diagnosis not present

## 2015-07-02 DIAGNOSIS — H35712 Central serous chorioretinopathy, left eye: Secondary | ICD-10-CM | POA: Diagnosis not present

## 2015-07-27 DIAGNOSIS — I10 Essential (primary) hypertension: Secondary | ICD-10-CM | POA: Diagnosis not present

## 2015-07-27 DIAGNOSIS — F325 Major depressive disorder, single episode, in full remission: Secondary | ICD-10-CM | POA: Diagnosis not present

## 2015-07-27 DIAGNOSIS — I2510 Atherosclerotic heart disease of native coronary artery without angina pectoris: Secondary | ICD-10-CM | POA: Diagnosis not present

## 2015-07-27 DIAGNOSIS — J42 Unspecified chronic bronchitis: Secondary | ICD-10-CM | POA: Diagnosis not present

## 2015-09-15 DIAGNOSIS — E785 Hyperlipidemia, unspecified: Secondary | ICD-10-CM | POA: Diagnosis not present

## 2015-09-15 DIAGNOSIS — F17200 Nicotine dependence, unspecified, uncomplicated: Secondary | ICD-10-CM | POA: Diagnosis not present

## 2015-09-15 DIAGNOSIS — I70213 Atherosclerosis of native arteries of extremities with intermittent claudication, bilateral legs: Secondary | ICD-10-CM | POA: Diagnosis not present

## 2015-09-15 DIAGNOSIS — I714 Abdominal aortic aneurysm, without rupture: Secondary | ICD-10-CM | POA: Diagnosis not present

## 2015-09-15 DIAGNOSIS — I739 Peripheral vascular disease, unspecified: Secondary | ICD-10-CM | POA: Diagnosis not present

## 2015-09-15 DIAGNOSIS — I6523 Occlusion and stenosis of bilateral carotid arteries: Secondary | ICD-10-CM | POA: Diagnosis not present

## 2015-09-15 DIAGNOSIS — I10 Essential (primary) hypertension: Secondary | ICD-10-CM | POA: Diagnosis not present

## 2015-10-13 DIAGNOSIS — H35712 Central serous chorioretinopathy, left eye: Secondary | ICD-10-CM | POA: Diagnosis not present

## 2015-10-16 DIAGNOSIS — H35712 Central serous chorioretinopathy, left eye: Secondary | ICD-10-CM | POA: Diagnosis not present

## 2015-12-18 DIAGNOSIS — H35712 Central serous chorioretinopathy, left eye: Secondary | ICD-10-CM | POA: Diagnosis not present

## 2016-02-16 DIAGNOSIS — I739 Peripheral vascular disease, unspecified: Secondary | ICD-10-CM | POA: Diagnosis not present

## 2016-02-16 DIAGNOSIS — J449 Chronic obstructive pulmonary disease, unspecified: Secondary | ICD-10-CM | POA: Diagnosis not present

## 2016-02-16 DIAGNOSIS — Z8673 Personal history of transient ischemic attack (TIA), and cerebral infarction without residual deficits: Secondary | ICD-10-CM | POA: Diagnosis not present

## 2016-02-16 DIAGNOSIS — R0602 Shortness of breath: Secondary | ICD-10-CM | POA: Diagnosis not present

## 2016-02-16 DIAGNOSIS — F17200 Nicotine dependence, unspecified, uncomplicated: Secondary | ICD-10-CM | POA: Diagnosis not present

## 2016-02-16 DIAGNOSIS — R011 Cardiac murmur, unspecified: Secondary | ICD-10-CM | POA: Diagnosis not present

## 2016-02-16 DIAGNOSIS — I639 Cerebral infarction, unspecified: Secondary | ICD-10-CM | POA: Diagnosis not present

## 2016-02-16 DIAGNOSIS — I2510 Atherosclerotic heart disease of native coronary artery without angina pectoris: Secondary | ICD-10-CM | POA: Diagnosis not present

## 2016-02-16 DIAGNOSIS — I10 Essential (primary) hypertension: Secondary | ICD-10-CM | POA: Diagnosis not present

## 2016-02-16 DIAGNOSIS — Z951 Presence of aortocoronary bypass graft: Secondary | ICD-10-CM | POA: Diagnosis not present

## 2016-02-25 ENCOUNTER — Other Ambulatory Visit (INDEPENDENT_AMBULATORY_CARE_PROVIDER_SITE_OTHER): Payer: Self-pay | Admitting: Vascular Surgery

## 2016-02-25 DIAGNOSIS — I779 Disorder of arteries and arterioles, unspecified: Secondary | ICD-10-CM

## 2016-02-25 DIAGNOSIS — I739 Peripheral vascular disease, unspecified: Principal | ICD-10-CM

## 2016-02-25 DIAGNOSIS — G458 Other transient cerebral ischemic attacks and related syndromes: Secondary | ICD-10-CM

## 2016-02-26 ENCOUNTER — Ambulatory Visit (INDEPENDENT_AMBULATORY_CARE_PROVIDER_SITE_OTHER): Payer: PPO | Admitting: Vascular Surgery

## 2016-02-26 ENCOUNTER — Encounter (INDEPENDENT_AMBULATORY_CARE_PROVIDER_SITE_OTHER): Payer: Self-pay | Admitting: Vascular Surgery

## 2016-02-26 ENCOUNTER — Ambulatory Visit (INDEPENDENT_AMBULATORY_CARE_PROVIDER_SITE_OTHER): Payer: PPO

## 2016-02-26 ENCOUNTER — Encounter (INDEPENDENT_AMBULATORY_CARE_PROVIDER_SITE_OTHER): Payer: Self-pay

## 2016-02-26 VITALS — BP 178/84 | HR 67 | Resp 16 | Ht 68.0 in | Wt 151.0 lb

## 2016-02-26 DIAGNOSIS — I779 Disorder of arteries and arterioles, unspecified: Secondary | ICD-10-CM | POA: Diagnosis not present

## 2016-02-26 DIAGNOSIS — I10 Essential (primary) hypertension: Secondary | ICD-10-CM | POA: Diagnosis not present

## 2016-02-26 DIAGNOSIS — I771 Stricture of artery: Secondary | ICD-10-CM | POA: Diagnosis not present

## 2016-02-26 DIAGNOSIS — I6523 Occlusion and stenosis of bilateral carotid arteries: Secondary | ICD-10-CM | POA: Diagnosis not present

## 2016-02-26 DIAGNOSIS — I739 Peripheral vascular disease, unspecified: Principal | ICD-10-CM

## 2016-02-26 DIAGNOSIS — E7800 Pure hypercholesterolemia, unspecified: Secondary | ICD-10-CM

## 2016-02-26 LAB — VAS US CAROTID
LCCADSYS: -102 cm/s
LEFT ECA DIAS: -9 cm/s
LEFT VERTEBRAL DIAS: 17 cm/s
LICADSYS: -60 cm/s
LICAPDIAS: -29 cm/s
LICAPSYS: -105 cm/s
Left CCA dist dias: -24 cm/s
Left CCA prox dias: 14 cm/s
Left CCA prox sys: 60 cm/s
Left ICA dist dias: -19 cm/s
RCCAPDIAS: 13 cm/s
RIGHT CCA MID DIAS: 18 cm/s
RIGHT ECA DIAS: -7 cm/s
RIGHT VERTEBRAL DIAS: 25 cm/s
Right CCA prox sys: 55 cm/s
Right cca dist sys: 83 cm/s

## 2016-02-26 NOTE — Assessment & Plan Note (Signed)
blood pressure control important in reducing the progression of atherosclerotic disease. On appropriate oral medications.  

## 2016-02-26 NOTE — Assessment & Plan Note (Signed)
Carotid duplex today demonstrates stable, mild carotid artery stenosis in the 1-39% bilaterally. No intervention necessary. Recheck in 1 year. Continue aspirin, Plavix, and statin agent.

## 2016-02-26 NOTE — Assessment & Plan Note (Signed)
About 5 years status post stenting and doing well. Duplex today demonstrates only mildly elevated velocities in the left subclavian artery stent. No intervention necessary. Recheck in 1 year. Continue aspirin, Plavix, and statin agent.

## 2016-02-26 NOTE — Progress Notes (Signed)
MRN : [id number]  Victoria Mccarty is a 76 y.o. ([birth year]) female who presents with chief complaint of  Chief Complaint  Patient presents with  . Follow-up  .  History of Present Illness: Patient returns today in follow-up of her carotid subclavian disease. She denies any recent complaints. She denies left arm claudication or rest pain. She denies focal neurologic symptoms. Specifically, the patient denies amaurosis fugax, speech or swallowing difficulties, or arm or leg weakness or numbness. Her duplex today shows stable, mild carotid artery stenosis in the 1-39% range bilaterally. Duplex shows only mildly elevated velocities in an otherwise patent left subclavian artery stent. Right subclavian artery velocities appear normal.  Current Outpatient Prescriptions  Medication Sig Dispense Refill  . acetaminophen (TYLENOL) 325 MG tablet One to two tabs by mouth every six hours as needed for mild pain    . atorvastatin (LIPITOR) 20 MG tablet Take 20 mg by mouth daily.    . B Complex Vitamins (VITAMIN B COMPLEX PO) Take 1 tablet by mouth daily.    . carvedilol (COREG) 25 MG tablet Take 25 mg by mouth 2 (two) times daily.    . cholecalciferol (VITAMIN D) 1000 UNITS tablet Take 1,000 Units by mouth daily.    . clopidogrel (PLAVIX) 75 MG tablet Take 75 mg by mouth at bedtime.     . folic acid (FOLVITE) 800 MCG tablet Take 800 mcg by mouth daily.    Marland Kitchen losartan (COZAAR) 100 MG tablet Take 100 mg by mouth daily.    . nitroGLYCERIN (NITROSTAT) 0.4 MG SL tablet Place under the tongue.    Marland Kitchen QUEtiapine (SEROQUEL XR) 300 MG 24 hr tablet Take 300 mg by mouth at bedtime.    Marland Kitchen QUEtiapine (SEROQUEL) 100 MG tablet Take 100 mg by mouth daily.    Marland Kitchen aspirin EC 81 MG EC tablet Take 1 tablet (81 mg total) by mouth daily. (Patient not taking: Reported on 02/26/2016) 30 tablet 0  . KRILL OIL PO Take 1 capsule by mouth daily.     No current facility-administered medications for this visit.     Past Medical  History:  Diagnosis Date  . Bipolar 1 disorder (HCC)   . Coronary artery disease   . Depression   . Hyperlipemia   . Hypertension   . Kidney cysts    Right  . Low back pain   . Myocardial infarction   . Osteoarthritis   . Psychosis   . SOB (shortness of breath) on exertion   . Stroke [facility])    3 years ago no residual effect  . TIA (transient ischemic attack)     Past Surgical History:  Procedure Laterality Date  . ABDOMINAL HYSTERECTOMY    . BLEPHAROPLASTY Bilateral   . CARDIAC SURGERY    . CORONARY ARTERY BYPASS GRAFT     6 vessels 8 years ago  . PERIPHERAL VASCULAR CATHETERIZATION Bilateral 08/28/2014   Procedure: Endovascular Repair/Stent Graft;  Surgeon: Annice Needy, MD;  Location: [facility] INVASIVE CV LAB;  Service: Cardiovascular;  Laterality: Bilateral;  . SUBCLAVIAN STENT PLACEMENT Left   . tumor resection kidney Right     Social History Social History  Substance Use Topics  . Smoking status: Current Every Day Smoker    Packs/day: 1.00    Years: 50.00  . Smokeless tobacco: Never Used  . Alcohol use 1.2 oz/week    2 Standard drinks or equivalent per week     Comment: wine occ  Family History Family History  Problem Relation Age of Onset  . CVA    . CAD    . Hematuria Sister      No Known Allergies   REVIEW OF SYSTEMS (Negative unless checked)  Constitutional: [] Weight loss  [] Fever  [] Chills Cardiac: [] Chest pain   [] Chest pressure   [] Palpitations   [] Shortness of breath when laying flat   [] Shortness of breath at rest   [] Shortness of breath with exertion. Vascular:  [] Pain in legs with walking   [] Pain in legs at rest   [] Pain in legs when laying flat   [] Claudication   [] Pain in feet when walking  [] Pain in feet at rest  [] Pain in feet when laying flat   [] History of DVT   [] Phlebitis   [] Swelling in legs   [] Varicose veins   [] Non-healing ulcers Pulmonary:   [] Uses home oxygen   [] Productive cough   [] Hemoptysis   [] Wheeze  [] COPD    [] Asthma Neurologic:  [] Dizziness  [] Blackouts   [] Seizures   [] History of stroke   [] History of TIA  [] Aphasia   [] Temporary blindness   [] Dysphagia   [] Weakness or numbness in arms   [] Weakness or numbness in legs Musculoskeletal:  [] Arthritis   [] Joint swelling   [] Joint pain   [] Low back pain Hematologic:  [] Easy bruising  [] Easy bleeding   [] Hypercoagulable state   [] Anemic  [] Hepatitis Gastrointestinal:  [] Blood in stool   [] Vomiting blood  [] Gastroesophageal reflux/heartburn   [] Difficulty swallowing. Genitourinary:  [] Chronic kidney disease   [] Difficult urination  [] Frequent urination  [] Burning with urination   [] Blood in urine Skin:  [] Rashes   [] Ulcers   [] Wounds Psychological:  [] History of anxiety   []  History of major depression.  Physical Examination  Vitals:   02/26/16 1112 02/26/16 1113  BP: (!) 160/78 (!) 178/84  Pulse: 67   Resp: 16   Weight: 151 lb (68.5 kg)   Height: 5\' 8"  (1.727 m)    Body mass index is 22.96 kg/m. Gen:  WD/WN, NAD Head: [place]/AT, No temporalis wasting. Ear/Nose/Throat: Hearing grossly intact, nares w/o erythema or drainage, trachea midline Eyes: Conjunctiva clear. Sclera non-icteric Neck: Supple.  No JVD.  Pulmonary:  Good air movement, equal and clear to auscultation bilaterally.  Cardiac: RRR, normal S1, S2, no Murmurs, rubs or gallops. Vascular:  Vessel Right Left  Radial Palpable Palpable  Ulnar Palpable Palpable  Brachial Palpable Palpable  Carotid Palpable, without bruit Palpable, with bruit  Aorta Not palpable N/A  Femoral Palpable Palpable  Popliteal Palpable Palpable  PT Palpable Palpable  DP Palpable Palpable   Gastrointestinal: soft, non-tender/non-distended. No guarding/reflex.  Musculoskeletal: M/S 5/5 throughout.  No deformity or atrophy.  Neurologic: CN 2-12 intact. Sensation grossly intact in extremities.  Symmetrical.  Speech is fluent. Motor exam as listed above. Psychiatric: Judgment intact, Mood & affect appropriate  for pt's clinical situation. Dermatologic: No rashes or ulcers noted.  No cellulitis or open wounds. Lymph : No Cervical, Axillary, or Inguinal lymphadenopathy.     CBC Lab Results  Component Value Date   WBC 8.6 08/28/2014   HGB 14.3 08/28/2014   HCT 41.4 08/28/2014   MCV 93.6 08/28/2014   PLT 150 08/28/2014    BMET    Component Value Date/Time   NA 141 08/28/2014 0726   NA 143 08/27/2011 0419   K 4.0 08/28/2014 0726   K 3.5 08/27/2011 0419   CL 108 08/28/2014 0726   CL 108 (H) 08/27/2011 0419  CO2 25 08/28/2014 0726   CO2 25 08/27/2011 0419   GLUCOSE 102 (H) 08/28/2014 0726   GLUCOSE 94 08/27/2011 0419   BUN 12 08/28/2014 0726   BUN 15 10/06/2011 0828   CREATININE 0.93 08/28/2014 0726   CREATININE 0.89 10/06/2011 0828   CALCIUM 9.1 08/28/2014 0726   CALCIUM 8.7 08/27/2011 0419   GFRNONAA 60 (L) 08/28/2014 0726   GFRNONAA >60 10/06/2011 0828   GFRAA >60 08/28/2014 0726   GFRAA >60 10/06/2011 0828   CrCl cannot be calculated (Patient's most recent lab result is older than the maximum 21 days allowed.).  COAG Lab Results  Component Value Date   INR 1.03 08/21/2014   INR 1.00 08/06/2014   INR 1.1 08/29/2011    Radiology No results found.    Assessment/Plan Hypercholesterolemia without hypertriglyceridemia blood pressure control important in reducing the progression of atherosclerotic disease. On appropriate oral medications.   Benign essential HTN blood pressure control important in reducing the progression of atherosclerotic disease. On appropriate oral medications.   Subclavian artery stenosis, left (HCC) About 5 years status post stenting and doing well. Duplex today demonstrates only mildly elevated velocities in the left subclavian artery stent. No intervention necessary. Recheck in 1 year. Continue aspirin, Plavix, and statin agent.  Bilateral carotid artery disease (HCC) Carotid duplex today demonstrates stable, mild carotid artery stenosis  in the 1-39% bilaterally. No intervention necessary. Recheck in 1 year. Continue aspirin, Plavix, and statin agent.    Festus Barren, MD  02/26/2016 1:01 PM    This note was created with Dragon medical transcription system.  Any errors from dictation are purely unintentional

## 2016-03-11 DIAGNOSIS — I10 Essential (primary) hypertension: Secondary | ICD-10-CM | POA: Diagnosis not present

## 2016-03-11 DIAGNOSIS — I2510 Atherosclerotic heart disease of native coronary artery without angina pectoris: Secondary | ICD-10-CM | POA: Diagnosis not present

## 2016-03-22 DIAGNOSIS — I10 Essential (primary) hypertension: Secondary | ICD-10-CM | POA: Diagnosis not present

## 2016-03-22 DIAGNOSIS — F325 Major depressive disorder, single episode, in full remission: Secondary | ICD-10-CM | POA: Diagnosis not present

## 2016-03-22 DIAGNOSIS — J42 Unspecified chronic bronchitis: Secondary | ICD-10-CM | POA: Diagnosis not present

## 2016-03-22 DIAGNOSIS — Z0000 Encounter for general adult medical examination without abnormal findings: Secondary | ICD-10-CM | POA: Diagnosis not present

## 2016-04-03 ENCOUNTER — Emergency Department (HOSPITAL_COMMUNITY): Payer: PPO

## 2016-04-03 ENCOUNTER — Inpatient Hospital Stay (HOSPITAL_COMMUNITY)
Admission: EM | Admit: 2016-04-03 | Discharge: 2016-04-10 | DRG: 064 | Disposition: E | Payer: PPO | Attending: Neurology | Admitting: Neurology

## 2016-04-03 ENCOUNTER — Inpatient Hospital Stay (HOSPITAL_COMMUNITY): Payer: PPO

## 2016-04-03 ENCOUNTER — Encounter (HOSPITAL_COMMUNITY): Payer: Self-pay | Admitting: Emergency Medicine

## 2016-04-03 DIAGNOSIS — Z515 Encounter for palliative care: Secondary | ICD-10-CM | POA: Diagnosis not present

## 2016-04-03 DIAGNOSIS — R414 Neurologic neglect syndrome: Secondary | ICD-10-CM | POA: Diagnosis not present

## 2016-04-03 DIAGNOSIS — R29810 Facial weakness: Secondary | ICD-10-CM | POA: Diagnosis not present

## 2016-04-03 DIAGNOSIS — I619 Nontraumatic intracerebral hemorrhage, unspecified: Secondary | ICD-10-CM | POA: Diagnosis not present

## 2016-04-03 DIAGNOSIS — Z7902 Long term (current) use of antithrombotics/antiplatelets: Secondary | ICD-10-CM | POA: Diagnosis not present

## 2016-04-03 DIAGNOSIS — F319 Bipolar disorder, unspecified: Secondary | ICD-10-CM | POA: Diagnosis present

## 2016-04-03 DIAGNOSIS — Z823 Family history of stroke: Secondary | ICD-10-CM

## 2016-04-03 DIAGNOSIS — I714 Abdominal aortic aneurysm, without rupture: Secondary | ICD-10-CM | POA: Diagnosis not present

## 2016-04-03 DIAGNOSIS — H5347 Heteronymous bilateral field defects: Secondary | ICD-10-CM | POA: Diagnosis not present

## 2016-04-03 DIAGNOSIS — I252 Old myocardial infarction: Secondary | ICD-10-CM | POA: Diagnosis not present

## 2016-04-03 DIAGNOSIS — Z7982 Long term (current) use of aspirin: Secondary | ICD-10-CM

## 2016-04-03 DIAGNOSIS — G8194 Hemiplegia, unspecified affecting left nondominant side: Secondary | ICD-10-CM | POA: Diagnosis not present

## 2016-04-03 DIAGNOSIS — J449 Chronic obstructive pulmonary disease, unspecified: Secondary | ICD-10-CM | POA: Diagnosis not present

## 2016-04-03 DIAGNOSIS — Y929 Unspecified place or not applicable: Secondary | ICD-10-CM

## 2016-04-03 DIAGNOSIS — I6389 Other cerebral infarction: Secondary | ICD-10-CM

## 2016-04-03 DIAGNOSIS — R29716 NIHSS score 16: Secondary | ICD-10-CM | POA: Diagnosis present

## 2016-04-03 DIAGNOSIS — I69354 Hemiplegia and hemiparesis following cerebral infarction affecting left non-dominant side: Secondary | ICD-10-CM

## 2016-04-03 DIAGNOSIS — I6789 Other cerebrovascular disease: Secondary | ICD-10-CM | POA: Diagnosis not present

## 2016-04-03 DIAGNOSIS — E785 Hyperlipidemia, unspecified: Secondary | ICD-10-CM | POA: Diagnosis not present

## 2016-04-03 DIAGNOSIS — R1112 Projectile vomiting: Secondary | ICD-10-CM | POA: Diagnosis not present

## 2016-04-03 DIAGNOSIS — S0510XA Contusion of eyeball and orbital tissues, unspecified eye, initial encounter: Secondary | ICD-10-CM | POA: Diagnosis not present

## 2016-04-03 DIAGNOSIS — R200 Anesthesia of skin: Secondary | ICD-10-CM | POA: Diagnosis not present

## 2016-04-03 DIAGNOSIS — E119 Type 2 diabetes mellitus without complications: Secondary | ICD-10-CM | POA: Diagnosis present

## 2016-04-03 DIAGNOSIS — R471 Dysarthria and anarthria: Secondary | ICD-10-CM | POA: Diagnosis present

## 2016-04-03 DIAGNOSIS — G936 Cerebral edema: Secondary | ICD-10-CM | POA: Diagnosis not present

## 2016-04-03 DIAGNOSIS — Z66 Do not resuscitate: Secondary | ICD-10-CM | POA: Diagnosis not present

## 2016-04-03 DIAGNOSIS — Z8673 Personal history of transient ischemic attack (TIA), and cerebral infarction without residual deficits: Secondary | ICD-10-CM

## 2016-04-03 DIAGNOSIS — I618 Other nontraumatic intracerebral hemorrhage: Secondary | ICD-10-CM | POA: Diagnosis not present

## 2016-04-03 DIAGNOSIS — Z955 Presence of coronary angioplasty implant and graft: Secondary | ICD-10-CM | POA: Diagnosis not present

## 2016-04-03 DIAGNOSIS — I2510 Atherosclerotic heart disease of native coronary artery without angina pectoris: Secondary | ICD-10-CM | POA: Diagnosis not present

## 2016-04-03 DIAGNOSIS — Z951 Presence of aortocoronary bypass graft: Secondary | ICD-10-CM | POA: Diagnosis not present

## 2016-04-03 DIAGNOSIS — R1110 Vomiting, unspecified: Secondary | ICD-10-CM

## 2016-04-03 DIAGNOSIS — I63511 Cerebral infarction due to unspecified occlusion or stenosis of right middle cerebral artery: Principal | ICD-10-CM | POA: Diagnosis present

## 2016-04-03 DIAGNOSIS — W19XXXA Unspecified fall, initial encounter: Secondary | ICD-10-CM | POA: Diagnosis not present

## 2016-04-03 DIAGNOSIS — I639 Cerebral infarction, unspecified: Secondary | ICD-10-CM | POA: Diagnosis not present

## 2016-04-03 DIAGNOSIS — R531 Weakness: Secondary | ICD-10-CM | POA: Diagnosis not present

## 2016-04-03 DIAGNOSIS — I739 Peripheral vascular disease, unspecified: Secondary | ICD-10-CM | POA: Diagnosis present

## 2016-04-03 DIAGNOSIS — F17200 Nicotine dependence, unspecified, uncomplicated: Secondary | ICD-10-CM | POA: Diagnosis present

## 2016-04-03 DIAGNOSIS — I6523 Occlusion and stenosis of bilateral carotid arteries: Secondary | ICD-10-CM | POA: Diagnosis not present

## 2016-04-03 DIAGNOSIS — Z90710 Acquired absence of both cervix and uterus: Secondary | ICD-10-CM

## 2016-04-03 DIAGNOSIS — R29722 NIHSS score 22: Secondary | ICD-10-CM | POA: Diagnosis not present

## 2016-04-03 DIAGNOSIS — R51 Headache: Secondary | ICD-10-CM | POA: Diagnosis not present

## 2016-04-03 DIAGNOSIS — S0011XA Contusion of right eyelid and periocular area, initial encounter: Secondary | ICD-10-CM | POA: Diagnosis not present

## 2016-04-03 DIAGNOSIS — R4701 Aphasia: Secondary | ICD-10-CM | POA: Diagnosis not present

## 2016-04-03 DIAGNOSIS — Z8249 Family history of ischemic heart disease and other diseases of the circulatory system: Secondary | ICD-10-CM | POA: Diagnosis not present

## 2016-04-03 DIAGNOSIS — E7800 Pure hypercholesterolemia, unspecified: Secondary | ICD-10-CM | POA: Diagnosis present

## 2016-04-03 DIAGNOSIS — I708 Atherosclerosis of other arteries: Secondary | ICD-10-CM | POA: Diagnosis not present

## 2016-04-03 DIAGNOSIS — I10 Essential (primary) hypertension: Secondary | ICD-10-CM | POA: Diagnosis present

## 2016-04-03 DIAGNOSIS — R519 Headache, unspecified: Secondary | ICD-10-CM

## 2016-04-03 DIAGNOSIS — Z79899 Other long term (current) drug therapy: Secondary | ICD-10-CM

## 2016-04-03 DIAGNOSIS — I629 Nontraumatic intracranial hemorrhage, unspecified: Secondary | ICD-10-CM | POA: Diagnosis not present

## 2016-04-03 DIAGNOSIS — M1990 Unspecified osteoarthritis, unspecified site: Secondary | ICD-10-CM | POA: Diagnosis present

## 2016-04-03 LAB — COMPREHENSIVE METABOLIC PANEL
ALBUMIN: 3.5 g/dL (ref 3.5–5.0)
ALK PHOS: 54 U/L (ref 38–126)
ALT: 15 U/L (ref 14–54)
AST: 21 U/L (ref 15–41)
Anion gap: 9 (ref 5–15)
BILIRUBIN TOTAL: 0.7 mg/dL (ref 0.3–1.2)
BUN: 9 mg/dL (ref 6–20)
CALCIUM: 9.4 mg/dL (ref 8.9–10.3)
CO2: 26 mmol/L (ref 22–32)
CREATININE: 0.94 mg/dL (ref 0.44–1.00)
Chloride: 108 mmol/L (ref 101–111)
GFR calc Af Amer: >60 mL/min (ref >60)
GFR calc non Af Amer: 58 mL/min — ABNORMAL LOW (ref >60)
GLUCOSE: 101 mg/dL — AB (ref 65–99)
Potassium: 4.6 mmol/L (ref 3.5–5.1)
SODIUM: 143 mmol/L (ref 135–145)
Total Protein: 6.5 g/dL (ref 6.5–8.1)

## 2016-04-03 LAB — I-STAT CHEM 8, ED
BUN: 11 mg/dL (ref 6–20)
CALCIUM ION: 1.16 mmol/L (ref 1.15–1.40)
CREATININE: 0.9 mg/dL (ref 0.44–1.00)
Chloride: 107 mmol/L (ref 101–111)
Glucose, Bld: 100 mg/dL — ABNORMAL HIGH (ref 65–99)
HEMATOCRIT: 44 % (ref 36.0–46.0)
Hemoglobin: 15 g/dL (ref 12.0–15.0)
Potassium: 4.5 mmol/L (ref 3.5–5.1)
Sodium: 143 mmol/L (ref 135–145)
TCO2: 27 mmol/L (ref 0–100)

## 2016-04-03 LAB — APTT: aPTT: 32 seconds (ref 24–36)

## 2016-04-03 LAB — URINALYSIS, ROUTINE W REFLEX MICROSCOPIC
BILIRUBIN URINE: NEGATIVE
Glucose, UA: NEGATIVE mg/dL
Hgb urine dipstick: NEGATIVE
KETONES UR: NEGATIVE mg/dL
Leukocytes, UA: NEGATIVE
NITRITE: NEGATIVE
PH: 7 (ref 5.0–8.0)
PROTEIN: NEGATIVE mg/dL
Specific Gravity, Urine: 1.005 (ref 1.005–1.030)

## 2016-04-03 LAB — DIFFERENTIAL
Basophils Absolute: 0 10*3/uL (ref 0.0–0.1)
Basophils Relative: 1 %
Eosinophils Absolute: 0.2 10*3/uL (ref 0.0–0.7)
Eosinophils Relative: 3 %
LYMPHS ABS: 1.6 10*3/uL (ref 0.7–4.0)
LYMPHS PCT: 23 %
Monocytes Absolute: 1 10*3/uL (ref 0.1–1.0)
Monocytes Relative: 15 %
NEUTROS ABS: 4 10*3/uL (ref 1.7–7.7)
NEUTROS PCT: 58 %

## 2016-04-03 LAB — RAPID URINE DRUG SCREEN, HOSP PERFORMED
AMPHETAMINES: NOT DETECTED
BARBITURATES: NOT DETECTED
Benzodiazepines: NOT DETECTED
Cocaine: NOT DETECTED
Opiates: NOT DETECTED
TETRAHYDROCANNABINOL: NOT DETECTED

## 2016-04-03 LAB — ETHANOL: Alcohol, Ethyl (B): <5 mg/dL (ref <5)

## 2016-04-03 LAB — I-STAT TROPONIN, ED: Troponin i, poc: 0 ng/mL (ref 0.00–0.08)

## 2016-04-03 LAB — CBC
HCT: 43.8 % (ref 36.0–46.0)
HEMOGLOBIN: 15 g/dL (ref 12.0–15.0)
MCH: 33 pg (ref 26.0–34.0)
MCHC: 34.2 g/dL (ref 30.0–36.0)
MCV: 96.3 fL (ref 78.0–100.0)
PLATELETS: 128 10*3/uL — AB (ref 150–400)
RBC: 4.55 MIL/uL (ref 3.87–5.11)
RDW: 13.4 % (ref 11.5–15.5)
WBC: 6.8 10*3/uL (ref 4.0–10.5)

## 2016-04-03 LAB — PROTIME-INR
INR: 1.07
PROTHROMBIN TIME: 13.9 s (ref 11.4–15.2)

## 2016-04-03 MED ORDER — QUETIAPINE FUMARATE 200 MG PO TABS: 300.0000 mg | ORAL_TABLET | Freq: Every day | ORAL | Status: DC

## 2016-04-03 MED ORDER — VITAMIN B-1 100 MG PO TABS: 100.0000 mg | ORAL_TABLET | Freq: Every day | ORAL | Status: DC

## 2016-04-03 MED ORDER — ACETAMINOPHEN 650 MG RE SUPP
650.0000 mg | RECTAL | Status: DC | PRN
  Administered 2016-04-03 – 2016-04-04 (×2): 650 mg via RECTAL
  Filled 2016-04-03 (×2): qty 1

## 2016-04-03 MED ORDER — ATORVASTATIN CALCIUM 80 MG PO TABS: 80.0000 mg | ORAL_TABLET | Freq: Every day | ORAL | Status: DC

## 2016-04-03 MED ORDER — FOLIC ACID 800 MCG PO TABS: 800.0000 ug | ORAL_TABLET | Freq: Every day | ORAL | Status: DC

## 2016-04-03 MED ORDER — THIAMINE HCL 100 MG/ML IJ SOLN
100.0000 mg | Freq: Every day | INTRAMUSCULAR | Status: DC
  Administered 2016-04-04: 100 mg via INTRAVENOUS
  Filled 2016-04-03: qty 2

## 2016-04-03 MED ORDER — VITAMIN D 1000 UNITS PO TABS: 1000.0000 [IU] | ORAL_TABLET | Freq: Every day | ORAL | Status: DC

## 2016-04-03 MED ORDER — FOLIC ACID 1 MG PO TABS: 1.0000 mg | ORAL_TABLET | Freq: Every day | ORAL | Status: DC

## 2016-04-03 MED ORDER — ADULT MULTIVITAMIN W/MINERALS CH: 1.0000 | ORAL_TABLET | Freq: Every day | ORAL | Status: DC

## 2016-04-03 MED ORDER — ASPIRIN 300 MG RE SUPP
300.0000 mg | Freq: Every day | RECTAL | Status: DC
  Administered 2016-04-03: 300 mg via RECTAL
  Filled 2016-04-03: qty 1

## 2016-04-03 MED ORDER — IOPAMIDOL (ISOVUE-370) INJECTION 76%
INTRAVENOUS | Status: AC
  Administered 2016-04-03: 100 mL
  Filled 2016-04-03: qty 100

## 2016-04-03 MED ORDER — ENOXAPARIN SODIUM 40 MG/0.4ML ~~LOC~~ SOLN
40.0000 mg | SUBCUTANEOUS | Status: DC
  Administered 2016-04-03: 40 mg via SUBCUTANEOUS
  Filled 2016-04-03: qty 0.4

## 2016-04-03 MED ORDER — ACETAMINOPHEN 160 MG/5ML PO SOLN: 650.0000 mg | ORAL | Status: DC | PRN

## 2016-04-03 MED ORDER — ACETAMINOPHEN 325 MG PO TABS: 650.0000 mg | ORAL_TABLET | ORAL | Status: DC | PRN

## 2016-04-03 MED ORDER — STROKE: EARLY STAGES OF RECOVERY BOOK
Freq: Once | Status: AC
  Administered 2016-04-03: 21:00:00

## 2016-04-03 MED ORDER — BUPROPION HCL ER (XL) 300 MG PO TB24
300.0000 mg | ORAL_TABLET | Freq: Every day | ORAL | Status: DC
  Filled 2016-04-03: qty 1

## 2016-04-03 MED ORDER — SODIUM CHLORIDE 0.9 % IV SOLN
INTRAVENOUS | Status: DC
  Administered 2016-04-04: 07:00:00 via INTRAVENOUS
  Filled 2016-04-03 (×2): qty 1000

## 2016-04-03 MED ORDER — LORAZEPAM 2 MG/ML IJ SOLN
0.5000 mg | Freq: Once | INTRAMUSCULAR | Status: AC
  Administered 2016-04-03: 0.5 mg via INTRAVENOUS
  Filled 2016-04-03: qty 1

## 2016-04-03 NOTE — Progress Notes (Signed)
Received from ER via stretcher; patient is sedate from Ativan given for MRI; following a few simple commands; oriented patient and family to room and unit routine; fall safety in place;bed low and locked; bed alarm on.

## 2016-04-03 NOTE — ED Notes (Signed)
Doctors continue to be at bedside.

## 2016-04-03 NOTE — ED Notes (Signed)
Earring given back to patient's sister. Earring removed for CT.

## 2016-04-03 NOTE — Progress Notes (Signed)
'  SLP Cancellation Note  Patient Details Name: Victoria LevinsSusan M Mccarty MRN: [id number] DOB: 12/11/1940   Cancelled treatment:       Reason Eval/Treat Not Completed: Fatigue/lethargy limiting ability to participate. Pt just arrived to floor. Ativan given for MRI, pt lethargic and not responsive to commands. SLP will follow up.  Rondel BatonMary Beth Oryn Casanova, [place] CF-SLP Speech-Language Pathologist [phone]   Arlana LindauMary E Rollins Wrightson 2016-11-20, 4:23 PM

## 2016-04-03 NOTE — ED Notes (Signed)
Neurologist at bedside. 

## 2016-04-03 NOTE — ED Notes (Signed)
Spoke with Neurologist. Patient will not go to IR able to take patient to floor.

## 2016-04-03 NOTE — H&P (Signed)
Date: 10/06/2016               Patient Name:  Victoria LevinsSusan M Adger MRN: [id number]  DOB: 08/26/1940 Age / Sex: 76 y.o., female   PCP: Lauro RegulusMarshall W Anderson, MD         Medical Service: Internal Medicine Teaching Service         Attending Physician: Dr. Earl LagosNischal Narendra, MD    First Contact: Dr. Peggyann Juba'Sullivan Pager: [phone]  Second Contact: Dr. Loney Lohathore Pager: [phone]       After Hours (After 5p/  First Contact Pager: [phone]  weekends / holidays): Second Contact Pager: [phone]   Chief Complaint: "I woke up this morning and couldn't move my left side."  History of Present Illness: Victoria LevinsSusan M Bhat is a 76 y.o. female with history of CVA, CAD, HTN, HL, and COPD who presents with acute left sided weakness, facial droop, and slurred speech since waking up this morning.  Yesterday evening she was in her usual state of health when she went to bed.  This morning around 0800 she woke up and couldn't roll over because her left side was weak.  She was slurring her speech and had a left-sided facial droop.  Her husband tried to help her stand from the bed but she couldn't support herself due to weakness and slumped back down.  At that time he called 911.  Her deficits have been stable over the course of the day, and she developed a right temporal headache since reaching the ED.  She had a TIA several years ago with no residual deficts.  Meds:  Current Meds  Medication Sig  . acetaminophen (TYLENOL) 325 MG tablet One to two tabs by mouth every six hours as needed for mild pain  . atorvastatin (LIPITOR) 20 MG tablet Take 20 mg by mouth daily.  . B Complex Vitamins (VITAMIN B COMPLEX PO) Take 1 tablet by mouth daily.  Marland Kitchen. buPROPion (WELLBUTRIN XL) 300 MG 24 hr tablet Take 300 mg by mouth daily.  . carvedilol (COREG) 25 MG tablet Take 25 mg by mouth 2 (two) times daily.  . cholecalciferol (VITAMIN D) 1000 UNITS tablet Take 1,000 Units by mouth daily.  . clopidogrel (PLAVIX) 75 MG tablet Take 75 mg by mouth  at bedtime.   . folic acid (FOLVITE) 800 MCG tablet Take 800 mcg by mouth daily.  Marland Kitchen. KRILL OIL PO Take 1 capsule by mouth daily.  Marland Kitchen. losartan (COZAAR) 100 MG tablet Take 100 mg by mouth daily.  . QUEtiapine (SEROQUEL) 100 MG tablet Take 100 mg by mouth daily.  . QUEtiapine (SEROQUEL) 300 MG tablet Take 300 mg by mouth at bedtime.  Did not take any meds today   Allergies: Allergies as of 009/27/2018  . (No Known Allergies)   Past Medical History:  Diagnosis Date  . Bipolar 1 disorder (HCC)   . Coronary artery disease   . Depression   . Hyperlipemia   . Hypertension   . Kidney cysts    Right  . Low back pain   . Myocardial infarction   . Osteoarthritis   . Psychosis   . SOB (shortness of breath) on exertion   . Stroke [facility](HCC)    3 years ago no residual effect  . TIA (transient ischemic attack)     Family History: Father with CAD (MI 3150s or 3060s), HTN 3 siblings with HTN  Social History:  Drinks 2-4 glasses of wine daily Current 1 ppd smoker, 50 pk  years No other drugs Lives in house with husband  Review of Systems: A complete ROS was negative except as per HPI.  Physical Exam: Blood pressure (!) 206/63, pulse 79, temperature 97.9 F (36.6 C), resp. rate 18, weight 150 lb (68 kg), SpO2 96 %.  Physical Exam  Constitutional:  Well developed woman lying in bed in no distress with marked left-sided facial droop  HENT:  Head: Normocephalic and atraumatic.  Mouth/Throat: Oropharynx is clear and moist.  Eyes: Conjunctivae are normal. No scleral icterus.  Neck: Normal range of motion. Neck supple.  Cardiovascular: Normal rate and regular rhythm.   Pulmonary/Chest: Effort normal and breath sounds normal.  Abdominal: She exhibits no distension. There is no tenderness.  Musculoskeletal: She exhibits no edema or tenderness.  Neurological:  Slightly somnolent,conversational, oriented Central acuity better than CF Right gaze preference, eyes conjugate, able to cross midline  but full EOM limited by participation PERRL Left facial droop with ptosis and flattening of nasolabial fold Dysarthric, no aphasia Tongue and uvula midline Sensation to light touch reduced on left throughout Shoulder elevation 5/5 right 0/5 left RUE 5/5 throughout, left 0/5 RLE 5/5 throughout, left 4/5 hip flexion, knee flex/extend, dorsi/plantarflex FtN intact with R hand Somatosensory extinction and left-sided neglect  Skin:  Diffuse ecchymoses  Psychiatric: She has a normal mood and affect. Her behavior is normal.    CBC Latest Ref Rng & Units Apr 11, 2016 Apr 11, 2016 08/28/2014  WBC 4.0 - 10.5 K/uL - 6.8 8.6  Hemoglobin 12.0 - 15.0 g/dL 16.1 09.6 04.5  Hematocrit 36.0 - 46.0 % 44.0 43.8 41.4  Platelets 150 - 400 K/uL - 128(L) 150   CMP Latest Ref Rng & Units 04-11-2016 Apr 11, 2016 08/28/2014  Glucose 65 - 99 mg/dL 409(W) 119(J) 478(G)  BUN 6 - 20 mg/dL 11 9 12   Creatinine 0.44 - 1.00 mg/dL 9.56 2.13 0.86  Sodium 135 - 145 mmol/L 143 143 141  Potassium 3.5 - 5.1 mmol/L 4.5 4.6 4.0  Chloride 101 - 111 mmol/L 107 108 108  CO2 22 - 32 mmol/L - 26 25  Calcium 8.9 - 10.3 mg/dL - 9.4 9.1  Total Protein 6.5 - 8.1 g/dL - 6.5 -  Total Bilirubin 0.3 - 1.2 mg/dL - 0.7 -  Alkaline Phos 38 - 126 U/L - 54 -  AST 15 - 41 U/L - 21 -  ALT 14 - 54 U/L - 15 -   CT Head without Contrast April 11, 2016 R MCA hyperdensity and loss of grey-white distinction in R temporal lobe.  CTA Head and Neck 2016/04/11 IMPRESSION: 1. 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 2. 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 3. The above was discussed by telephone with Dr. Rhona Leavens on Apr 11, 2016 at 1313 hours. 4. 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 5. Superimposed left MCA bifurcation atherosclerosis with mild to moderate left M2 origin stenoses. 6. 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 7. Patent left subclavian artery stent. Up to moderate stenosis of the left vertebral artery origin. 50% stenosis of the right subclavian artery origin due to atherosclerosis. No significant right vertebral artery stenosis. 8. Otherwise negative posterior circulation.  MRI Brain April 11, 2016 IMPRESSION: 1. Relatively large acute right MCA territory infarct with good agreement to the earlier CT perfusion. The posterosuperior right MCA territory is relatively spared.  2. No evidence of associated hemorrhage. No significant intracranial mass effect at this time. 3. The examination had to be discontinued prior to completion despite patient sedation.  Assessment & Plan by Problem: Active Problems:   CVA (cerebral vascular accident) [facility])   76 y.o. female with vascular risk factors and prior CAD and DVA presenting with acute onset dense left-sided neurologic deficits consistent with large R MCA stroke.  Cerebral perfusion study to not suggest perfused penumbra, no endovascular intervention. Will evaluate risk factors and optimize medical management.  Due to large size of stroke, she is at risk of cerebral edema and hemorrhagic conversion and requires close monitoring.  #Right MCA Stroke  -Appreciate further Neurology evaluation and management recommendations -Echocardiogram -Increase Atorvastatin to 80 mg daily -Permissive hypertension -SLP, PT, OT -A1c, lipid panel -Smoking cessation -Low threshold to repeat head CT if exam changes  #HTN Permissive HTN with goal >220/110. -Hold home carvedilol, losartan  #CAD #HL -Increase statin to atorvastatin 80 mg daily  #Bipolar Disorder -Hold home seroquel, bupropion  #Alcohol Dependence Drinks 2-4 glasses of wine daily. -CIWA monitoring  Fluids: NS 100 mL/hr Diet: NPO DVT Prophylaxis: lovenox Code Status: full  Dispo:  Admit patient to Inpatient with expected length of stay greater than 2 midnights.  Signed: Alm Bustard, MD 03/25/2016, 11:32 AM  Pager: [phone]

## 2016-04-03 NOTE — ED Notes (Signed)
2 nurses attempted IV 18g for CT. Paged IV team.

## 2016-04-03 NOTE — ED Notes (Signed)
Admit Doctor spoke with patient, husband, and sister with nurse at bedside regarding DNR status.  Patient alert answering questions appropriate. Currently full code.

## 2016-04-03 NOTE — ED Notes (Signed)
Doctor notified of patient's blood pressure. Ordered to keep monitoring at this time.

## 2016-04-03 NOTE — ED Notes (Signed)
Husband and sister arrived at bedside Doctor notified.

## 2016-04-03 NOTE — ED Triage Notes (Signed)
Arrived by EMS LKW 2100 yesterday family found patient to have left side weakness and flaccid per EMS.  EMS reported patient alert answering and following commands appropriate garbled speech left side facial droop left side flaccid.

## 2016-04-03 NOTE — ED Notes (Signed)
Daughter arrived at bedside.

## 2016-04-03 NOTE — ED Notes (Signed)
Admit Doctor at bedside.  

## 2016-04-03 NOTE — ED Notes (Signed)
Spoke with Neurologist stated plan to have CT perfusion then possible IR before patient goes to floor.

## 2016-04-03 NOTE — ED Provider Notes (Signed)
[facility] DEPT Provider Note   CSN: [id number] Arrival date & time: Oct 04, 2016  0940     History   Chief Complaint Chief Complaint  Patient presents with  . Cerebrovascular Accident    HPI Victoria Mccarty is a 76 y.o. female.  76 year old female with history of history of CVA and TIAs who is on Plavix presents with left upper and lower extremity and left facial weakness which she noted when she woke up this morning. Went to bed last at about 9:30 and this was her last seen normal. Woke this morning and had trouble speaking with left-sided facial droop. Has had a mild headache without visual changes. No vomiting. No recent fever or chills. Denies any chest or abdominal discomfort. Does not have any residual deficits from her prior CVA. Called EMS and was transported here.      Past Medical History:  Diagnosis Date  . Bipolar 1 disorder (HCC)   . Coronary artery disease   . Depression   . Hyperlipemia   . Hypertension   . Kidney cysts    Right  . Low back pain   . Myocardial infarction   . Osteoarthritis   . Psychosis   . SOB (shortness of breath) on exertion   . Stroke [facility](HCC)    3 years ago no residual effect  . TIA (transient ischemic attack)     Patient Active Problem List   Diagnosis Date Noted  . Subclavian artery stenosis, left (HCC) 02/26/2016  . Bilateral carotid artery disease (HCC) 02/26/2016  . Benign essential HTN 09/16/2014  . Hypercholesterolemia without hypertriglyceridemia 09/16/2014  . AAA (abdominal aortic aneurysm) (HCC) 08/27/2014  . CVA (cerebral infarction) 08/06/2014  . Cerebral artery occlusion with cerebral infarction (HCC) 08/06/2014  . Brachial-basilar insufficiency syndrome 10/22/2013  . CAD in native artery 10/06/2013  . Artery disease, cerebral 10/06/2013  . CAFL (chronic airflow limitation) (HCC) 10/06/2013  . Depression, major, in partial remission (HCC) 10/06/2013    Past Surgical History:  Procedure Laterality Date  .  ABDOMINAL HYSTERECTOMY    . BLEPHAROPLASTY Bilateral   . CARDIAC SURGERY    . CORONARY ARTERY BYPASS GRAFT     6 vessels 8 years ago  . PERIPHERAL VASCULAR CATHETERIZATION Bilateral 08/28/2014   Procedure: Endovascular Repair/Stent Graft;  Surgeon: Annice NeedyJason S Dew, MD;  Location: [facility] INVASIVE CV LAB;  Service: Cardiovascular;  Laterality: Bilateral;  . SUBCLAVIAN STENT PLACEMENT Left   . tumor resection kidney Right     OB History    No data available       Home Medications    Prior to Admission medications   Medication Sig Start Date End Date Taking? Authorizing Provider  acetaminophen (TYLENOL) 325 MG tablet One to two tabs by mouth every six hours as needed for mild pain 08/29/14   Ranae PlumberKimberly A Stegmayer, PA-C  aspirin EC 81 MG EC tablet Take 1 tablet (81 mg total) by mouth daily. Patient not taking: Reported on 02/26/2016 08/08/14   Alford Highlandichard Wieting, MD  atorvastatin (LIPITOR) 20 MG tablet Take 20 mg by mouth daily.    Historical Provider, MD  B Complex Vitamins (VITAMIN B COMPLEX PO) Take 1 tablet by mouth daily.    Historical Provider, MD  carvedilol (COREG) 25 MG tablet Take 25 mg by mouth 2 (two) times daily.    Historical Provider, MD  cholecalciferol (VITAMIN D) 1000 UNITS tablet Take 1,000 Units by mouth daily.    Historical Provider, MD  clopidogrel (PLAVIX) 75 MG tablet Take  75 mg by mouth at bedtime.     Historical Provider, MD  folic acid (FOLVITE) 800 MCG tablet Take 800 mcg by mouth daily.    Historical Provider, MD  KRILL OIL PO Take 1 capsule by mouth daily.    Historical Provider, MD  losartan (COZAAR) 100 MG tablet Take 100 mg by mouth daily.    Historical Provider, MD  nitroGLYCERIN (NITROSTAT) 0.4 MG SL tablet Place under the tongue.    Historical Provider, MD  QUEtiapine (SEROQUEL XR) 300 MG 24 hr tablet Take 300 mg by mouth at bedtime.    Historical Provider, MD  QUEtiapine (SEROQUEL) 100 MG tablet Take 100 mg by mouth daily.    Historical Provider, MD    Family  History Family History  Problem Relation Age of Onset  . CVA    . CAD    . Hematuria Sister     Social History Social History  Substance Use Topics  . Smoking status: Current Every Day Smoker    Packs/day: 1.00    Years: 50.00  . Smokeless tobacco: Never Used  . Alcohol use 1.2 oz/week    2 Standard drinks or equivalent per week     Comment: wine occ     Allergies   Patient has no known allergies.   Review of Systems Review of Systems  All other systems reviewed and are negative.    Physical Exam Updated Vital Signs SpO2 99%   Physical Exam  Constitutional: She is oriented to person, place, and time. She appears well-developed and well-nourished.  Non-toxic appearance. No distress.  HENT:  Head: Normocephalic and atraumatic.  Eyes: Conjunctivae, EOM and lids are normal. Pupils are equal, round, and reactive to light.  Neck: Normal range of motion. Neck supple. No tracheal deviation present. No thyroid mass present.  Cardiovascular: Normal rate, regular rhythm and normal heart sounds.  Exam reveals no gallop.   No murmur heard. Pulmonary/Chest: Effort normal and breath sounds normal. No stridor. No respiratory distress. She has no decreased breath sounds. She has no wheezes. She has no rhonchi. She has no rales.  Abdominal: Soft. Normal appearance and bowel sounds are normal. She exhibits no distension. There is no tenderness. There is no rebound and no CVA tenderness.  Musculoskeletal: Normal range of motion. She exhibits no edema or tenderness.  Neurological: She is alert and oriented to person, place, and time. A cranial nerve deficit and sensory deficit is present. GCS eye subscore is 4. GCS verbal subscore is 5. GCS motor subscore is 6.  Left-sided facial droop noted. Left upper extremity flaccid and left lower extremity 4 out of 5 strength. Patient's speech is slightly garbled. Cerebellar functions intact on the right upper extremity. Cannot assess on the left.    Skin: Skin is warm and dry. No abrasion and no rash noted.  Psychiatric: She has a normal mood and affect. Her speech is normal and behavior is normal.  Nursing note and vitals reviewed.    ED Treatments / Results  Labs (all labs ordered are listed, but only abnormal results are displayed) Labs Reviewed  ETHANOL  PROTIME-INR  APTT  CBC  DIFFERENTIAL  COMPREHENSIVE METABOLIC PANEL  RAPID URINE DRUG SCREEN, HOSP PERFORMED  URINALYSIS, ROUTINE W REFLEX MICROSCOPIC  I-STAT CHEM 8, ED  I-STAT TROPOININ, ED    EKG  EKG Interpretation  Date/Time:  Sunday April 03 2016 09:55:09 EDT Ventricular Rate:  62 PR Interval:    QRS Duration: 114 QT Interval:  422 QTC  Calculation: 429 R Axis:   93 Text Interpretation:  Sinus rhythm Anterior infarct, old Nonspecific T abnormalities, inferior leads No significant change since last tracing Confirmed by Chole Driver  MD, Brenna Friesenhahn ([id number]) on 04/21/16 10:02:56 AM       Radiology No results found.  Procedures Procedures (including critical care time)  Medications Ordered in ED Medications - No data to display   Initial Impression / Assessment and Plan / ED Course  I have reviewed the triage vital signs and the nursing notes.  Pertinent labs & imaging results that were available during my care of the patient were reviewed by me and considered in my medical decision making (see chart for details).     Patient's last seen normal over 12 hours ago. Patient TPA window. Patient's head CT without acute blood. Spoke with neurology who will see the patient consultations. Patient's blood pressure noted but we'll continue to monitor at this time. They recommended admission to the hospitalist service.  Final Clinical Impressions(s) / ED Diagnoses   Final diagnoses:  None    New Prescriptions New Prescriptions   No medications on file     Lorre Nick, MD April 21, 2016 1139

## 2016-04-03 NOTE — ED Notes (Signed)
Neurology at bedside.

## 2016-04-03 NOTE — ED Notes (Signed)
Called nurse on floor for report. Currently assisting another patient.

## 2016-04-03 NOTE — ED Notes (Signed)
Called floor for report patient assisting another patient at this time.

## 2016-04-03 NOTE — ED Notes (Signed)
Nurse will transport patient to CT

## 2016-04-03 NOTE — ED Notes (Signed)
MRI called requested to have patient receive medication for MRI.  Paged admit Doctor.

## 2016-04-03 NOTE — ED Notes (Signed)
Nurse returned transporting patient from CT.

## 2016-04-03 NOTE — ED Notes (Signed)
Doctor at bedside.

## 2016-04-03 NOTE — ED Notes (Signed)
Patient stated she urinated 2 nurses assisted changing patient and placed a diaper on patient.

## 2016-04-03 NOTE — ED Notes (Signed)
Admit Doctor called will review chart and order medications for MRI.

## 2016-04-03 NOTE — Consult Note (Signed)
Neurology Consult Note  Reason for Consultation: Presumed stroke with left sided weakness  Requesting provider: Lacretia Leigh, MD  CC: "I'm cold"  HPI: This is a 41-yo woman who presented to the ED via EMS for the evaluation of L-sided weakness. Her husband states that at about 730 or 8 this morning, they got up. He was in another room when he heard her call his name. He went to check on her and noticed that she had slurred speech. She wanted to sit up so he went to help her when he noticed that she was weak on her left side. They called 911 and she was brought to the [facility] ED for evaluation. In the ED she was noted to have a left facial droop, a flaccid LUE, and 4/5 strength in the LLE. Her speech was also noted to be garbled. She was sent for [facility] which was initially interpreted as showing no acute abnormality. She was admitted to the medicine service for routine stroke admission and neurology consultation was requested to assist.   I met the patient in the ED. On my exam, she has a dense left hemiparesis with flaccid L arm and relative sparing of the face. She has a L hemianopsia, incomplete L hemineglect, dysarthria, and mild L hemisensory loss. On my read, CTH shows a hyperdense R MCA sign with early ischemic changes in the R hemisphere including basal ganglia, insula, frontal lobe, and temporal lobe with ASPECTS of 4. She was sent for STAT CTA head/neck and CT perfusion to see if she might be a candidate for intervention. Unfortunately, this showed a large infarct core not amenable to treatment and she has been admitted for routine stroke management.   Last known well: 2130 on 04/02/16 NHISS score: 16 tPA given?: No, patient is outside of the window   PMH:  Past Medical History:  Diagnosis Date  . Bipolar 1 disorder ([place])   . Coronary artery disease   . Depression   . Hyperlipemia   . Hypertension   . Kidney cysts    Right  . Low back pain   . Myocardial infarction   .  Osteoarthritis   . Psychosis   . SOB (shortness of breath) on exertion   . Stroke [facility])    3 years ago no residual effect  . TIA (transient ischemic attack)     PSH:  Past Surgical History:  Procedure Laterality Date  . ABDOMINAL HYSTERECTOMY    . BLEPHAROPLASTY Bilateral   . CARDIAC SURGERY    . CORONARY ARTERY BYPASS GRAFT     6 vessels 8 years ago  . PERIPHERAL VASCULAR CATHETERIZATION Bilateral 08/28/2014   Procedure: Endovascular Repair/Stent Graft;  Surgeon: Algernon Huxley, MD;  Location: [place] CV LAB;  Service: Cardiovascular;  Laterality: Bilateral;  . SUBCLAVIAN STENT PLACEMENT Left   . tumor resection kidney Right     Family history: Family History  Problem Relation Age of Onset  . CVA    . CAD    . Hematuria Sister     Social history:  Social History   Social History  . Marital status: Married    Spouse name: N/A  . Number of children: N/A  . Years of education: N/A   Occupational History  . Not on file.   Social History Main Topics  . Smoking status: Current Every Day Smoker    Packs/day: 1.00    Years: 50.00  . Smokeless tobacco: Never Used  . Alcohol use 1.2  oz/week    2 Standard drinks or equivalent per week     Comment: wine occ  . Drug use: No  . Sexual activity: Not on file   Other Topics Concern  . Not on file   Social History Narrative  . No narrative on file    Current outpatient meds: Medications reviewed and reconciled Current Meds  Medication Sig  . acetaminophen (TYLENOL) 325 MG tablet One to two tabs by mouth every six hours as needed for mild pain  . atorvastatin (LIPITOR) 20 MG tablet Take 20 mg by mouth daily.  . B Complex Vitamins (VITAMIN B COMPLEX PO) Take 1 tablet by mouth daily.  Marland Kitchen buPROPion (WELLBUTRIN XL) 300 MG 24 hr tablet Take 300 mg by mouth daily.  . carvedilol (COREG) 25 MG tablet Take 25 mg by mouth 2 (two) times daily.  . cholecalciferol (VITAMIN D) 1000 UNITS tablet Take 1,000 Units by mouth daily.   . clopidogrel (PLAVIX) 75 MG tablet Take 75 mg by mouth at bedtime.   . folic acid (FOLVITE) 751 MCG tablet Take 800 mcg by mouth daily.  Marland Kitchen KRILL OIL PO Take 1 capsule by mouth daily.  Marland Kitchen losartan (COZAAR) 100 MG tablet Take 100 mg by mouth daily.  . QUEtiapine (SEROQUEL) 100 MG tablet Take 100 mg by mouth daily.  . QUEtiapine (SEROQUEL) 300 MG tablet Take 300 mg by mouth at bedtime.    Current inpatient meds: Medications reviewed and reconciled No current facility-administered medications for this encounter.    Current Outpatient Prescriptions  Medication Sig Dispense Refill  . acetaminophen (TYLENOL) 325 MG tablet One to two tabs by mouth every six hours as needed for mild pain    . atorvastatin (LIPITOR) 20 MG tablet Take 20 mg by mouth daily.    . B Complex Vitamins (VITAMIN B COMPLEX PO) Take 1 tablet by mouth daily.    Marland Kitchen buPROPion (WELLBUTRIN XL) 300 MG 24 hr tablet Take 300 mg by mouth daily.    . carvedilol (COREG) 25 MG tablet Take 25 mg by mouth 2 (two) times daily.    . cholecalciferol (VITAMIN D) 1000 UNITS tablet Take 1,000 Units by mouth daily.    . clopidogrel (PLAVIX) 75 MG tablet Take 75 mg by mouth at bedtime.     . folic acid (FOLVITE) 700 MCG tablet Take 800 mcg by mouth daily.    Marland Kitchen KRILL OIL PO Take 1 capsule by mouth daily.    Marland Kitchen losartan (COZAAR) 100 MG tablet Take 100 mg by mouth daily.    . QUEtiapine (SEROQUEL) 100 MG tablet Take 100 mg by mouth daily.    . QUEtiapine (SEROQUEL) 300 MG tablet Take 300 mg by mouth at bedtime.    Marland Kitchen aspirin EC 81 MG EC tablet Take 1 tablet (81 mg total) by mouth daily. (Patient not taking: Reported on 02/26/2016) 30 tablet 0  . nitroGLYCERIN (NITROSTAT) 0.4 MG SL tablet Place under the tongue.    Marland Kitchen QUEtiapine (SEROQUEL XR) 300 MG 24 hr tablet Take 300 mg by mouth at bedtime.      Allergies: No Known Allergies  ROS: As per HPI. A full 14-point review of systems was performed and is otherwise notable for some intermittent R-sided  headache. ROS otherwise unremarkable.  PE:  BP (!) 209/70   Pulse 86   Temp 97.9 F (36.6 C)   Resp 16   Wt 68 kg (150 lb)   SpO2 96%   BMI 22.81 kg/m  General: WDWN, no acute distress. AAO x4. Speech notable for moderate dysarthria. No aphasia. Follows commands briskly. Affect is bright with congruent mood. Comportment is normal. She has inattention to the left side but is not completely neglecting.  HEENT: Normocephalic. Neck supple without LAD. Mucous membranes moist. Dentition good. Sclerae anicteric. Mild conjunctival injection.  CV: Regular, no murmur. Carotid pulses full and symmetric, no bruits. Distal pulses 2+ and symmetric.  Lungs: CTAB on anterior exam.   Abdomen: Soft, non-distended, non-tender. Bowel sounds present x4.  Extremities: No C/C/E. Neuro:  CN: Pupils are equal and round. They are symmetrically reactive from 3-->2 mm. She does not blink to threat from the L side. She has a right gaze preference but is able to volitionally direct eyes past midline to the L. She left corneal is diminished but she reports intact light touch. She has a left central VII pattern of weakness.  Hearing is intact to conversational voice. Palate elevates weakly on the L with some slight deviation of the uvula to the R. L shoulder shrug is weak. Tongue deviates to the L.   Motor: Normal bulk. She has a flaccid RUE with no movement of that limb. She has 4- to 4/5 strength in the LLE. Strength is normal on the R. No tremor or other abnormal movements.   Sensation: Sensation diminished on the L.  DTRs: 2+ on the R, absent on the L. Toes downgoing on the R, upgoing on the L.  Coordination: Finger-to-nose and heel-to-shin are without dysmetria on the R but cannot be assessed on the L due to weakness.   Labs:  Lab Results  Component Value Date   WBC 6.8 03/31/2016   HGB 15.0 03/16/2016   HCT 44.0 04/08/2016   PLT 128 (L) 03/21/2016   GLUCOSE 100 (H) 03/23/2016   CHOL 149 08/08/2014   TRIG  116 08/08/2014   HDL 36 (L) 08/08/2014   LDLCALC 90 08/08/2014   ALT 15 03/13/2016   AST 21 03/17/2016   NA 143 03/16/2016   K 4.5 03/31/2016   CL 107 03/29/2016   CREATININE 0.90 03/23/2016   BUN 11 04/02/2016   CO2 26 04/07/2016   TSH 1.717 08/06/2014   INR 1.07 03/19/2016   Troponin 0.00 PTT 32 EtOH <5 UA negative Urine drug screen negative  Imaging:  I have personally and independently reviewed the [place] without contrast from today. This shows what appears to be a hyperdense R MCA with loss of gray-white differentiation around the R basal ganglia, insula, and temporal cortex. Relative hypodensity in these areas suggests subacute ischemic stroke. Volumes are appropriate for age. Mild small vessel disease is noted. There is an old lacunar infarct in the L basal ganglia.   I have personally and independently reviewed the CT perfusion from today. This shows a large infarct core (CBF<30%) with volume of 98 mL. There is a large area with Tmax >6 sec measuring 203 mL with resultant mismatch of 105 mL.   I have personally and independently reviewed the CTA of the head and neck from today. This shows occlusion of the right internal carotid artery at the level of the carotid siphon as well as occlusion of the right MCA. There appears to be some stenosis of the left internal carotid artery around the level of the carotid siphon.  Assessment and Plan: 1. Acute Ischemic Stroke: This is an acute stroke involving the R MCA territory. It is most likely embolic in etiology, though cannot fully exclude the possibility  of a RICA dissection. Unfortunately no intervention is available as the infarct core is too large for thrombectomy. Known risk factors for cerebrovascular disease in this patient include age, CAD, hyperlipidemia, HTN, h/o prior stroke/TIA, and tobacco abuse. MRI brain, TTE, fasting lipids, and hemoglobin a1c. Further testing will be determined by results from these initial studies. She was  on aspirin and Plavix at home. Recommend continuing aspirin for now. Continue atorvastatin with goal LDL less than 70. Ensure adequate glucose control. Allow permissive hypertension in the acute phase, treating only SBP greater than 220 mmHg and/or DBP greater than 110 mmHg. Avoid fever and hyperglycemia as these can extend the infarct. Avoid hypotonic IVF to minimize exacerbation of post-stroke edema. Initiate rehab services. DVT prophylaxis as needed. Given the size of her infarct with involvement of the temporal pole, she will need to be observed very closely for evidence of increased cerebral edema and possible shift. Edema is typically maximal 3-5 days after stroke. Any significant change in neurologic status should prompt emergent CT scan of the head for further evaluation.  2. Acute left hemiparesis: This is severe, due to acute stroke. PT/OT/rehab.   3. Left hemineglect: This is acute, due to stroke. PT/OT/rehab.   4. Left hemisensory loss: This is acute, due to stroke. PT/OT/rehab.   5. Dysarthria: This is severe and acute, due to stroke. ST/rehab. NPO until cleared by swallow evaluation.   6. Left hemianopsia: This is acute, due to stroke. PT/OT/rehab.  The patient would likely benefit from acute rehab services. Recommend consultation of PT/OT/SLP with consideration for PM&R consult as appropriate depending upon clinical progress and therapists' recommendations.   Fall risk: Risk factors for falls include age, acute neurologic deficits (L hemiparesis, L neglect, L hemisensory loss, L hemianopsia), use of psychotropic/sedative/hypnotic medications, polypharmacy, female gender. Strict fall precautions. Limit psychoactive medications and sedating medications.  Delirium risk: Risk factors for delirium include age, acute stroke, severity of medical illness, medications/polypharmacy, sensory impairment (decreased vision, decreased hearing at baseline), metabolic derangement. Continue to optimize  metabolic status as you are. Treat any infection aggressively. Minimize the use of opiates, benzos or any medication with strong anticholinergic properties as much as possible. Optimize sleep-wake cycles as much as you can by keeping the room bright with activity during the day and dark and quiet at night.   Needs outpatient neurology follow-up?: She will need neurology follow up after discharge.   This was discussed with the patient and her husband, sister, and daughter. Education was provided on the diagnosis and expected evaluation and treatment. They are in agreement with the plan as noted. They were given the opportunity to ask any questions and these were addressed to their satisfaction.   I have discussed the impression and plan with the admitting residents.  Thank you for this consultation. The stroke team will continue to follow beginning 03/16/2016. Please call if there are any urgent questions or concerns.  This patient is critically ill and at significant risk of neurological worsening, death and care requires constant monitoring of vital signs, hemodynamics,respiratory and cardiac monitoring, neurological assessment, discussion with family, other specialists and medical decision making of high complexity. A total of 135 minutes of critical care time was spent on this case.

## 2016-04-03 NOTE — ED Notes (Signed)
Patient was ready to be transported to floor and a transporter arrived to transport patient to MRI. Nurse and transporter transported patient to MRI. Will return to ED after MRI and then be transported to floor.  Charge nurse notified on floor.

## 2016-04-03 NOTE — ED Notes (Signed)
Nurse transported patient and returned with patient from CT.

## 2016-04-04 ENCOUNTER — Encounter (HOSPITAL_COMMUNITY): Payer: Self-pay | Admitting: *Deleted

## 2016-04-04 ENCOUNTER — Inpatient Hospital Stay (HOSPITAL_COMMUNITY): Payer: PPO

## 2016-04-04 ENCOUNTER — Inpatient Hospital Stay (HOSPITAL_COMMUNITY): Payer: Medicare Other

## 2016-04-04 DIAGNOSIS — I6789 Other cerebrovascular disease: Secondary | ICD-10-CM

## 2016-04-04 DIAGNOSIS — I10 Essential (primary) hypertension: Secondary | ICD-10-CM

## 2016-04-04 DIAGNOSIS — I639 Cerebral infarction, unspecified: Secondary | ICD-10-CM

## 2016-04-04 DIAGNOSIS — I638 Other cerebral infarction: Secondary | ICD-10-CM

## 2016-04-04 DIAGNOSIS — I63131 Cerebral infarction due to embolism of right carotid artery: Secondary | ICD-10-CM

## 2016-04-04 DIAGNOSIS — I6389 Other cerebral infarction: Secondary | ICD-10-CM

## 2016-04-04 LAB — LIPID PANEL
CHOL/HDL RATIO: 3.1 ratio
Cholesterol: 194 mg/dL (ref 0–200)
HDL: 62 mg/dL (ref >40)
LDL Cholesterol: 100 mg/dL — ABNORMAL HIGH (ref 0–99)
Triglycerides: 159 mg/dL — ABNORMAL HIGH (ref <150)
VLDL: 32 mg/dL (ref 0–40)

## 2016-04-04 LAB — GLUCOSE, CAPILLARY: Glucose-Capillary: 154 mg/dL — ABNORMAL HIGH (ref 65–99)

## 2016-04-04 LAB — ECHOCARDIOGRAM COMPLETE: WEIGHTICAEL: 2400 [oz_av]

## 2016-04-04 LAB — MRSA PCR SCREENING: MRSA by PCR: NEGATIVE

## 2016-04-04 MED ORDER — HYDRALAZINE HCL 20 MG/ML IJ SOLN
INTRAMUSCULAR | Status: AC
  Filled 2016-04-04: qty 1

## 2016-04-04 MED ORDER — ACETAMINOPHEN 650 MG RE SUPP: 650.0000 mg | Freq: Four times a day (QID) | RECTAL | Status: DC | PRN

## 2016-04-04 MED ORDER — HALOPERIDOL LACTATE 2 MG/ML PO CONC
0.5000 mg | ORAL | Status: DC | PRN
Start: 2016-04-04 — End: 2016-04-05
  Filled 2016-04-04: qty 0.3

## 2016-04-04 MED ORDER — MORPHINE BOLUS VIA INFUSION
2.0000 mg | INTRAVENOUS | Status: DC | PRN
  Filled 2016-04-04: qty 2

## 2016-04-04 MED ORDER — HALOPERIDOL LACTATE 5 MG/ML IJ SOLN: 0.5000 mg | INTRAMUSCULAR | Status: DC | PRN

## 2016-04-04 MED ORDER — ACETAMINOPHEN 325 MG PO TABS: 650.0000 mg | ORAL_TABLET | Freq: Four times a day (QID) | ORAL | Status: DC | PRN

## 2016-04-04 MED ORDER — ONDANSETRON HCL 4 MG/2ML IJ SOLN
INTRAMUSCULAR | Status: AC
  Filled 2016-04-04: qty 2

## 2016-04-04 MED ORDER — ONDANSETRON HCL 4 MG/2ML IJ SOLN
4.0000 mg | Freq: Four times a day (QID) | INTRAMUSCULAR | Status: DC | PRN
  Administered 2016-04-04: 4 mg via INTRAVENOUS

## 2016-04-04 MED ORDER — SODIUM CHLORIDE 0.9 % IV SOLN
0.0000 mg/h | INTRAVENOUS | Status: DC
  Administered 2016-04-04: 1 mg/h via INTRAVENOUS
  Filled 2016-04-04: qty 10

## 2016-04-04 MED ORDER — LABETALOL HCL 5 MG/ML IV SOLN
20.0000 mg | Freq: Once | INTRAVENOUS | Status: AC
  Administered 2016-04-04: 20 mg via INTRAVENOUS
  Filled 2016-04-04: qty 4

## 2016-04-04 MED ORDER — GLYCOPYRROLATE 0.2 MG/ML IJ SOLN: 0.2000 mg | INTRAMUSCULAR | Status: DC | PRN

## 2016-04-04 MED ORDER — ONDANSETRON 4 MG PO TBDP: 4.0000 mg | ORAL_TABLET | Freq: Four times a day (QID) | ORAL | Status: DC | PRN

## 2016-04-04 MED ORDER — MORPHINE SULFATE (PF) 2 MG/ML IV SOLN
2.0000 mg | INTRAVENOUS | Status: DC | PRN
  Administered 2016-04-04 (×2): 2 mg via INTRAVENOUS
  Filled 2016-04-04 (×2): qty 1

## 2016-04-04 MED ORDER — GLYCOPYRROLATE 1 MG PO TABS: 1.0000 mg | ORAL_TABLET | ORAL | Status: DC | PRN

## 2016-04-04 MED ORDER — ONDANSETRON HCL 4 MG/2ML IJ SOLN: 4.0000 mg | Freq: Four times a day (QID) | INTRAMUSCULAR | Status: DC | PRN

## 2016-04-04 MED ORDER — NICARDIPINE HCL IN NACL 20-0.86 MG/200ML-% IV SOLN
3.0000 mg/h | INTRAVENOUS | Status: DC
  Administered 2016-04-04: 5 mg/h via INTRAVENOUS
  Administered 2016-04-04: 3 mg/h via INTRAVENOUS
  Filled 2016-04-04: qty 200

## 2016-04-04 MED ORDER — HYDRALAZINE HCL 20 MG/ML IJ SOLN
10.0000 mg | Freq: Once | INTRAMUSCULAR | Status: AC
  Administered 2016-04-04: 10 mg via INTRAVENOUS

## 2016-04-04 MED ORDER — LABETALOL HCL 5 MG/ML IV SOLN
INTRAVENOUS | Status: AC
  Filled 2016-04-04: qty 4

## 2016-04-04 MED ORDER — NICARDIPINE HCL IN NACL 20-0.86 MG/200ML-% IV SOLN
INTRAVENOUS | Status: AC
  Filled 2016-04-04: qty 200

## 2016-04-04 MED ORDER — HALOPERIDOL 0.5 MG PO TABS
0.5000 mg | ORAL_TABLET | ORAL | Status: DC | PRN
  Filled 2016-04-04: qty 1

## 2016-04-04 MED ORDER — BIOTENE DRY MOUTH MT LIQD: 15.0000 mL | OROMUCOSAL | Status: DC | PRN

## 2016-04-04 MED ORDER — HYDRALAZINE HCL 20 MG/ML IJ SOLN: 20.0000 mg | INTRAMUSCULAR | Status: DC | PRN

## 2016-04-04 MED ORDER — LORAZEPAM 2 MG/ML IJ SOLN: 1.0000 mg | INTRAMUSCULAR | Status: DC | PRN

## 2016-04-04 MED ORDER — POLYVINYL ALCOHOL 1.4 % OP SOLN
1.0000 [drp] | Freq: Four times a day (QID) | OPHTHALMIC | Status: DC | PRN
  Filled 2016-04-04: qty 15

## 2016-04-04 NOTE — Progress Notes (Signed)
Noted bruises to pt's rt eye lid ,and pt shaking and smashing on the rt side  of her  Head and  verbalized it hurts. Tylenol 650 mg  supp given. PR. . NSG  staff noted pt."s bed  wet from  head of bed  to the buttocks  .  beddings was changed.   Within 5 min Nurse noted pt had a projectile vomit, clear in color and entire linen and gown wet again. Marland Kitchen. HOB   elevated . Pt suctioned orally and cleaned up. BP elevated 210/82 hr 53. MD notify.  MD came in   to see pt.. RN will continue to monitor pt closing  .

## 2016-04-04 NOTE — Progress Notes (Signed)
Night float interim progress note.  Paged by RN at 12:45 AM that patient is complaining of headache and having projectile vomiting. During evaluation patient was holding her right side of head, looks in mild distress because of pain and having a big oedematous bruise over her right eyelid. According to nurse she never noticed that when patient came to the floor. This is something new which they also noticed during reevaluation. According to nurse tach. Patient was holding her controller in her hand and may be accidentally hit her eye.  On exam. The patient was in mild distress, banging and holding her right side of head. Having large dark purplish blue ecchymosis and edema of right eyelid. Pt. Was aphasic with us, no other new deficit.  A/P. We were concerned about increased intracranial pressure or worsening of her stroke. Big bruise over right eyelid of unknown etiology. -Stop Lovenox -Repeat CT head. -CT orbits.

## 2016-04-04 NOTE — Progress Notes (Signed)
  Echocardiogram 2D Echocardiogram has been performed.  Janalyn HarderWest, Hiilei Gerst R 04/03/2016, 11:59 AM

## 2016-04-04 NOTE — Progress Notes (Signed)
SLP Cancellation Note  Patient Details Name: Victoria LevinsSusan M Mccarty MRN: [id number] DOB: 01/16/1940   Cancelled treatment:       Reason Eval/Treat Not Completed: Medical issues which prohibited therapy. Checked in with RN. Will follow for plan of care   Shellsea Borunda, Riley NearingBonnie Caroline 03/12/2016, 12:19 PM

## 2016-04-04 NOTE — Progress Notes (Signed)
PT Cancellation Note  Patient Details Name: Victoria LevinsSusan M Mccarty MRN: [id number] DOB: 03/14/1940   Cancelled Treatment:    Reason Eval/Treat Not Completed: Medical issues which prohibited therapy (noted events overnight with elevated BP, bradycardia and hemorrhagic transformation of R MCA CVA. Will hold at this time.)   Maudene Stotler B Yazeed Pryer 03/11/16, 7:20 AM Delaney MeigsMaija Tabor Chynna Buerkle, PT [phone]

## 2016-04-04 NOTE — Progress Notes (Signed)
Informed by nursing that family is ready to proceed with full comfort measures. As per Dr. Warren DanesXu's note, this is expected. I have entered orders to this effect.   Ritta SlotMcNeill Kirkpatrick, MD [facility] [phone]  If 7pm- 7am, please page neurology on call as listed in [facility].

## 2016-04-04 NOTE — Consult Note (Signed)
Reason for Consult: Intracerebral hematoma Referring Physician: [name] is an 76 y.o. female.  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 Patient had a fall injuring the right periorbital region. She was found to have subsequently developed an intracerebral hemorrhage on the right side in the frontal region with significant mass and some shift. Consultation is obtained. The patient has remained arousable and awake and is hemiplegic on left side.  Past Medical History:  Diagnosis Date  . Bipolar 1 disorder ([place])   . Coronary artery disease   . Depression   . Hyperlipemia   . Hypertension   . Kidney cysts    Right  . Low back pain   . Myocardial infarction   . Osteoarthritis   . Psychosis   . SOB (shortness of breath) on exertion   . Stroke [facility])    3 years ago no residual effect  . TIA (transient ischemic attack)     Past Surgical History:  Procedure Laterality Date  . ABDOMINAL HYSTERECTOMY    . BLEPHAROPLASTY Bilateral   . CARDIAC SURGERY    . CORONARY ARTERY BYPASS GRAFT     6 vessels 8 years ago  . PERIPHERAL VASCULAR CATHETERIZATION Bilateral 08/28/2014   Procedure: Endovascular Repair/Stent Graft;  Surgeon: Algernon Huxley, MD;  Location: [facility];  Service: Cardiovascular;  Laterality: Bilateral;  . SUBCLAVIAN STENT PLACEMENT Left   . tumor resection kidney Right     Family History  Problem Relation Age of Onset  . CVA    . CAD    . Hematuria Sister     Social History:  reports that she has been smoking.  She has a 50.00 pack-year smoking history. She has never used smokeless tobacco. She reports that she drinks about 1.2 oz of alcohol per week . She reports that she does not use drugs.  Allergies: No Known Allergies  Medications: I have reviewed the patient's current medications.  Results for orders placed or performed during the hospital encounter of  04/07/2016 (from the past 48 hour(s))  Ethanol     Status: None   Collection Time: 04/07/2016 10:07 AM  Result Value Ref Range   Alcohol, Ethyl (B) <5 <5 mg/dL    Comment:        LOWEST DETECTABLE LIMIT FOR SERUM ALCOHOL IS 5 mg/dL FOR MEDICAL PURPOSES ONLY   Protime-INR     Status: None   Collection Time: 03/23/2016 10:07 AM  Result Value Ref Range   Prothrombin Time 13.9 11.4 - 15.2 seconds   INR 1.07   APTT     Status: None   Collection Time: 04/02/2016 10:07 AM  Result Value Ref Range   aPTT 32 24 - 36 seconds  CBC     Status: Abnormal   Collection Time: 03/11/2016 10:07 AM  Result Value Ref Range   WBC 6.8 4.0 - 10.5 K/uL   RBC 4.55 3.87 - 5.11 MIL/uL   Hemoglobin 15.0 12.0 - 15.0 g/dL   HCT 43.8 36.0 - 46.0 %   MCV 96.3 78.0 - 100.0 fL   MCH 33.0 26.0 - 34.0 pg   MCHC 34.2 30.0 - 36.0 g/dL   RDW 13.4 11.5 - 15.5 %   Platelets 128 (L) 150 - 400 K/uL  Differential     Status: None   Collection Time: 03/26/2016 10:07 AM  Result Value Ref Range   Neutrophils  Relative % 58 %   Neutro Abs 4.0 1.7 - 7.7 K/uL   Lymphocytes Relative 23 %   Lymphs Abs 1.6 0.7 - 4.0 K/uL   Monocytes Relative 15 %   Monocytes Absolute 1.0 0.1 - 1.0 K/uL   Eosinophils Relative 3 %   Eosinophils Absolute 0.2 0.0 - 0.7 K/uL   Basophils Relative 1 %   Basophils Absolute 0.0 0.0 - 0.1 K/uL  Comprehensive metabolic panel     Status: Abnormal   Collection Time: 03/29/2016 10:07 AM  Result Value Ref Range   Sodium 143 135 - 145 mmol/L   Potassium 4.6 3.5 - 5.1 mmol/L   Chloride 108 101 - 111 mmol/L   CO2 26 22 - 32 mmol/L   Glucose, Bld 101 (H) 65 - 99 mg/dL   BUN 9 6 - 20 mg/dL   Creatinine, Ser 0.94 0.44 - 1.00 mg/dL   Calcium 9.4 8.9 - 10.3 mg/dL   Total Protein 6.5 6.5 - 8.1 g/dL   Albumin 3.5 3.5 - 5.0 g/dL   AST 21 15 - 41 U/L   ALT 15 14 - 54 U/L   Alkaline Phosphatase 54 38 - 126 U/L   Total Bilirubin 0.7 0.3 - 1.2 mg/dL   GFR calc non Af Amer 58 (L) >60 mL/min   GFR calc Af Amer >60 >60  mL/min    Comment: (NOTE) The eGFR has been calculated using the CKD EPI equation. This calculation has not been validated in all clinical situations. eGFR's persistently <60 mL/min signify possible Chronic Kidney Disease.    Anion gap 9 5 - 15  I-stat troponin, ED (not at [facility], [facility])     Status: None   Collection Time: 04/09/2016 10:16 AM  Result Value Ref Range   Troponin i, poc 0.00 0.00 - 0.08 ng/mL   Comment 3            Comment: Due to the release kinetics of cTnI, a negative result within the first hours of the onset of symptoms does not rule out myocardial infarction with certainty. If myocardial infarction is still suspected, repeat the test at appropriate intervals.   I-Stat Chem 8, ED  (not at [facility], [facility])     Status: Abnormal   Collection Time: 03/16/2016 10:18 AM  Result Value Ref Range   Sodium 143 135 - 145 mmol/L   Potassium 4.5 3.5 - 5.1 mmol/L   Chloride 107 101 - 111 mmol/L   BUN 11 6 - 20 mg/dL   Creatinine, Ser 0.90 0.44 - 1.00 mg/dL   Glucose, Bld 100 (H) 65 - 99 mg/dL   Calcium, Ion 1.16 1.15 - 1.40 mmol/L   TCO2 27 0 - 100 mmol/L   Hemoglobin 15.0 12.0 - 15.0 g/dL   HCT 44.0 36.0 - 46.0 %  Urine rapid drug screen (hosp performed)not at [facility]     Status: None   Collection Time: 03/31/2016 11:13 AM  Result Value Ref Range   Opiates NONE DETECTED NONE DETECTED   Cocaine NONE DETECTED NONE DETECTED   Benzodiazepines NONE DETECTED NONE DETECTED   Amphetamines NONE DETECTED NONE DETECTED   Tetrahydrocannabinol NONE DETECTED NONE DETECTED   Barbiturates NONE DETECTED NONE DETECTED    Comment:        DRUG SCREEN FOR MEDICAL PURPOSES ONLY.  IF CONFIRMATION IS NEEDED FOR ANY PURPOSE, NOTIFY LAB WITHIN 5 DAYS.        LOWEST DETECTABLE LIMITS FOR URINE DRUG SCREEN Drug Class  Cutoff (ng/mL) Amphetamine      1000 Barbiturate      200 Benzodiazepine   016 Tricyclics       010 Opiates          300 Cocaine          300 THC              50   Urinalysis,  Routine w reflex microscopic     Status: None   Collection Time: 03/10/2016 11:13 AM  Result Value Ref Range   Color, Urine YELLOW YELLOW   APPearance CLEAR CLEAR   Specific Gravity, Urine 1.005 1.005 - 1.030   pH 7.0 5.0 - 8.0   Glucose, UA NEGATIVE NEGATIVE mg/dL   Hgb urine dipstick NEGATIVE NEGATIVE   Bilirubin Urine NEGATIVE NEGATIVE   Ketones, ur NEGATIVE NEGATIVE mg/dL   Protein, ur NEGATIVE NEGATIVE mg/dL   Nitrite NEGATIVE NEGATIVE   Leukocytes, UA NEGATIVE NEGATIVE  Glucose, capillary     Status: Abnormal   Collection Time: 03/30/2016 12:38 AM  Result Value Ref Range   Glucose-Capillary 154 (H) 65 - 99 mg/dL   Comment 1 Notify RN    Comment 2 Document in Chart   MRSA PCR Screening     Status: None   Collection Time: 03/22/2016  4:12 AM  Result Value Ref Range   MRSA by PCR NEGATIVE NEGATIVE    Comment:        The GeneXpert MRSA Assay (FDA approved for NASAL specimens only), is one component of a comprehensive MRSA colonization surveillance program. It is not intended to diagnose MRSA infection nor to guide or monitor treatment for MRSA infections.     Ct Angio Head W Or Wo Contrast  Result Date: 03/29/2016 CLINICAL DATA:  76 year old female found at home by family with left side weakness, facial droop. Initial encounter. EXAM: CT ANGIOGRAPHY HEAD AND NECK CT PERFUSION BRAIN TECHNIQUE: Multidetector CT imaging of the head and neck was performed using the standard protocol during bolus administration of intravenous contrast. Multiplanar CT image reconstructions and MIPs were obtained to evaluate the vascular anatomy. Carotid stenosis measurements (when applicable) are obtained utilizing NASCET criteria, using the distal internal carotid diameter as the denominator. Multiphase CT imaging of the brain was performed following IV bolus contrast injection. Subsequent parametric perfusion maps were calculated using RAPID software. CONTRAST:  100 mL Isovue 370 COMPARISON:  None.  FINDINGS: CT Brain Perfusion Findings: CBF (<30%) Volume: 98 mL Perfusion (Tmax>6.0s) volume: 203 mL Mismatch Volume: 105 mL Infarction Location:  Right MCA territory. CTA NECK Skeleton: Prior median sternotomy. Degenerative changes in the cervical spine. Absent maxillary dentition. No acute osseous abnormality identified. Mild maxillary sinus mucosal thickening. Upper chest: Mild dependent upper lung atelectasis. No superior mediastinal lymphadenopathy. Other neck: Negative thyroid, larynx, pharynx, parapharyngeal spaces, retropharyngeal space, sublingual space, submandibular glands and parotid glands. No cervical lymphadenopathy. Aortic arch: Bovine type arch configuration with moderate soft and calcified arch atherosclerosis. Right carotid system: No brachiocephalic artery or right CCA origin despite soft and calcified plaque. Somewhat bulky calcified plaque along the proximal anterior right CCA but no associated stenosis. The right carotid bifurcation is patent with soft and calcified plaque but no associated stenosis. There is gradual loss of enhancement of the cervical right ICA distal to the bulb, such that by the skullbase there is virtually no enhancement of the vessel. Left carotid system: Bovine type left CCA origin with soft and calcified plaque but no stenosis. Medial soft and calcified plaque  in the left CCA at the level of the thyroid and larynx without stenosis. Mostly calcified plaque at the left carotid bifurcation. No proximal left ICA stenosis. Tortuosity distal to the bulb. Otherwise negative cervical left ICA. Vertebral arteries: Up to 50% proximal right subclavian artery stenosis related to soft and calcified plaque. Calcified plaque at the right vertebral artery origin with only mild stenosis (series 405, image 94). Mild right V2 and V3 segment calcified plaque without stenosis. Moderate soft and calcified plaque at the left subclavian artery origin. Just distal to the origin there is a left  subclavian artery stent but remains patent. The stent terminates just at the left vertebral artery origin which is patent. There is some soft plaque superimposed at the left vert origin, with up to moderate stenosis (series 405, image 97). Calcified and tortuous left V1 segment without hemodynamically significant stenosis. Occasional left V2 and V3 segment calcification without stenosis. CTA HEAD Posterior circulation: Co dominant somewhat dolichoectatic distal vertebral arteries are without stenosis. There is mild left V4 segment calcification. Both PICA origins are patent. Patent vertebrobasilar junction. No basilar stenosis. AICA, SCA, and PCA origins are normal. Posterior communicating arteries are diminutive or absent. Bilateral PCA branches are within normal limits. Anterior circulation: No enhancement of the right ICA siphon, right ICA terminus, or right MCA origin. No right MCA M1 enhancement. There is faint reconstituted flow in a minority of the right MCA branches (series 403, image 53). The right ACA including the a 1 segment is reconstituted probably from the anterior communicating artery. 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 The ACA A2 segment and bilateral ACA branches are within normal limits. The left ICA siphon is patent with mild to moderate calcified plaque. At the anterior genu directed posteriorly there is evidence of a a 3-4 mm partially calcified ICA aneurysm best seen on series 408, image 96 there is no left ICA stenosis up to this point. There is however moderate to severe proximal left supraclinoid stenosis due to calcified plaque (same image). The left ICA terminus is patent. The left MCA and ACA origins are normal. The left A1 segment is normal. Left MCA M1 segment is mildly irregular but without stenosis. The left MCA bifurcation is patent but with mild to moderate irregularity best seen on  series 406, image 21. No left MCA branch occlusion is identified, but there is mild to moderate stenosis of several left M2 origins. Venous sinuses: Appear to be patent. Anatomic variants: Bovine type aortic arch configuration. Review of the MIP images confirms the above findings IMPRESSION: 1. 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 2. 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 3. The above was discussed by telephone with Dr. Melba Coon on 03/30/2016 at 1313 hours. 4. 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 5. Superimposed left MCA bifurcation atherosclerosis with mild to moderate left M2 origin stenoses. 6. 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 7. Patent left subclavian artery stent. Up to moderate stenosis of the left vertebral artery origin. 50% stenosis of the right subclavian artery origin due to atherosclerosis. No significant right vertebral artery stenosis. 8. Otherwise negative posterior circulation. Electronically Signed   By: Genevie Ann M.D.   On: 04/02/2016 13:57   Ct  Head Wo Contrast  Result Date: 04/09/2016 CLINICAL DATA:  Initial evaluation for acute headache, stroke. EXAM: CT HEAD WITHOUT CONTRAST TECHNIQUE: Contiguous axial images were obtained from the base of the skull through the vertex without intravenous contrast. COMPARISON:  Prior studies from 03/15/2016. FINDINGS: Brain: There has been interval development of large intraparenchymal hematoma centered at the parasagittal anterior/inferior right frontal lobe. Hemorrhage measures 6.0 x 4.1 x 4.7 cm (estimated volume 57.8 mL). Localized mass effect with right-to-left shift of the anterior falx up to 12 mm. Extensive cytotoxic edema consistent with large evolving right MCA territory infarct, overall similar  in distribution relative to previous MRI. Probable increased mass effect. Right lateral ventricle of face. No hydrocephalus. No other acute intracranial hemorrhage. No other acute large vessel territory infarct. No extra-axial fluid collection. Vascular: Residual hyperdensity within the right MCA, likely reflecting thrombus as seen on prior CTA. Skull: Right periorbital contusion. Scalp soft tissues otherwise unremarkable. Calvarium intact. Sinuses/Orbits: Right periorbital contusion. Globes intact. Orbital soft tissues within normal limits. Scattered mucosal thickening within the maxillary sinuses bilaterally. Paranasal sinuses are clear. No mastoid effusion. Other: No other significant finding. IMPRESSION: 1. 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 Hemorrhagic transformation with new 6.0 x 4.1 x 4.7 cm intraparenchymal hematoma positioned at the parasagittal anterior/inferior right frontal lobe. Localized shift of the anterior falx up to 12 mm. 2. Right periorbital contusion. Critical Value/emergent results were called by telephone at the time of interpretation on 03/29/2016 at 2:44 am to Dr. Lorella Nimrod , who verbally acknowledged these results. Electronically Signed   By: Jeannine Boga M.D.   On:  02:54   Ct Head Wo Contrast  Addendum Date: 03/10/2016   ADDENDUM REPORT: 03/20/2016 13:20 ADDENDUM: On further review, there is hyperdensity in the right middle cerebral artery with subtle decreased attenuation in the right middle cerebral artery distribution. Dr. Nevada Crane, neuroradiology, discussed the case with the on-call neurologist. Electronically Signed   By: Lowella Grip III M.D.   On: 03/24/2016 13:20   Result Date: 03/16/2016 CLINICAL DATA:  Left-sided weakness EXAM: CT HEAD WITHOUT CONTRAST TECHNIQUE: Contiguous axial images were obtained from the base of the skull through the vertex without intravenous contrast. COMPARISON:  Head CT August 06, 2014 and brain MRI August 07, 2014  FINDINGS: Brain: There is age related volume loss. There is no intracranial mass, hemorrhage, extra-axial fluid collection, or midline shift. There is evidence of a prior small infarct in the posterior left lentiform nucleus. There is patchy small vessel disease in the centra semiovale bilaterally. No new gray-white compartment lesion is evident. No acute infarct appreciable. Vascular: No hyperdense vessel. There is calcification in each carotid siphon region. There is also calcification in the distal left vertebral artery. Skull: The bony calvarium appears intact. Sinuses/Orbits: There is mucosal thickening in both maxillary antra, considerably more on the left than on the right. There is mucosal thickening in several ethmoid air cells bilaterally. Other paranasal sinuses are clear. No intraorbital lesions are evident. Other: The mastoid air cells are clear. IMPRESSION: Age related volume loss. Patchy periventricular small vessel disease, stable. Prior small lacunar infarct posterior left lentiform nucleus, stable. No intracranial mass, hemorrhage, or extra-axial fluid collection. Several foci of arterial vascular calcification noted. Paranasal sinus disease noted in several areas, most severe in the left maxillary antrum. Electronically Signed: By: Lowella Grip III M.D. On: 03/16/2016 10:37   Ct Angio Neck W Or Wo Contrast  Result Date: 03/12/2016 CLINICAL DATA:  76 year old female found at  home by family with left side weakness, facial droop. Initial encounter. EXAM: CT ANGIOGRAPHY HEAD AND NECK CT PERFUSION BRAIN TECHNIQUE: Multidetector CT imaging of the head and neck was performed using the standard protocol during bolus administration of intravenous contrast. Multiplanar CT image reconstructions and MIPs were obtained to evaluate the vascular anatomy. Carotid stenosis measurements (when applicable) are obtained utilizing NASCET criteria, using the distal internal carotid diameter as the denominator.  Multiphase CT imaging of the brain was performed following IV bolus contrast injection. Subsequent parametric perfusion maps were calculated using RAPID software. CONTRAST:  100 mL Isovue 370 COMPARISON:  None. FINDINGS: CT Brain Perfusion Findings: CBF (<30%) Volume: 98 mL Perfusion (Tmax>6.0s) volume: 203 mL Mismatch Volume: 105 mL Infarction Location:  Right MCA territory. CTA NECK Skeleton: Prior median sternotomy. Degenerative changes in the cervical spine. Absent maxillary dentition. No acute osseous abnormality identified. Mild maxillary sinus mucosal thickening. Upper chest: Mild dependent upper lung atelectasis. No superior mediastinal lymphadenopathy. Other neck: Negative thyroid, larynx, pharynx, parapharyngeal spaces, retropharyngeal space, sublingual space, submandibular glands and parotid glands. No cervical lymphadenopathy. Aortic arch: Bovine type arch configuration with moderate soft and calcified arch atherosclerosis. Right carotid system: No brachiocephalic artery or right CCA origin despite soft and calcified plaque. Somewhat bulky calcified plaque along the proximal anterior right CCA but no associated stenosis. The right carotid bifurcation is patent with soft and calcified plaque but no associated stenosis. There is gradual loss of enhancement of the cervical right ICA distal to the bulb, such that by the skullbase there is virtually no enhancement of the vessel. Left carotid system: Bovine type left CCA origin with soft and calcified plaque but no stenosis. Medial soft and calcified plaque in the left CCA at the level of the thyroid and larynx without stenosis. Mostly calcified plaque at the left carotid bifurcation. No proximal left ICA stenosis. Tortuosity distal to the bulb. Otherwise negative cervical left ICA. Vertebral arteries: Up to 50% proximal right subclavian artery stenosis related to soft and calcified plaque. Calcified plaque at the right vertebral artery origin with only mild  stenosis (series 405, image 94). Mild right V2 and V3 segment calcified plaque without stenosis. Moderate soft and calcified plaque at the left subclavian artery origin. Just distal to the origin there is a left subclavian artery stent but remains patent. The stent terminates just at the left vertebral artery origin which is patent. There is some soft plaque superimposed at the left vert origin, with up to moderate stenosis (series 405, image 97). Calcified and tortuous left V1 segment without hemodynamically significant stenosis. Occasional left V2 and V3 segment calcification without stenosis. CTA HEAD Posterior circulation: Co dominant somewhat dolichoectatic distal vertebral arteries are without stenosis. There is mild left V4 segment calcification. Both PICA origins are patent. Patent vertebrobasilar junction. No basilar stenosis. AICA, SCA, and PCA origins are normal. Posterior communicating arteries are diminutive or absent. Bilateral PCA branches are within normal limits. Anterior circulation: No enhancement of the right ICA siphon, right ICA terminus, or right MCA origin. No right MCA M1 enhancement. There is faint reconstituted flow in a minority of the right MCA branches (series 403, image 53). The right ACA including the a 1 segment is reconstituted probably from the anterior communicating artery. 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 The ACA A2 segment and bilateral ACA branches are within normal limits. The left ICA siphon is patent with mild to moderate calcified plaque.  At the anterior genu directed posteriorly there is evidence of a a 3-4 mm partially calcified ICA aneurysm best seen on series 408, image 96 there is no left ICA stenosis up to this point. There is however moderate to severe proximal left supraclinoid stenosis due to calcified plaque (same image). The left ICA terminus is patent. The left  MCA and ACA origins are normal. The left A1 segment is normal. Left MCA M1 segment is mildly irregular but without stenosis. The left MCA bifurcation is patent but with mild to moderate irregularity best seen on series 406, image 21. No left MCA branch occlusion is identified, but there is mild to moderate stenosis of several left M2 origins. Venous sinuses: Appear to be patent. Anatomic variants: Bovine type aortic arch configuration. Review of the MIP images confirms the above findings IMPRESSION: 1. 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 2. 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 3. The above was discussed by telephone with Dr. Melba Coon on 03/19/2016 at 1313 hours. 4. 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 5. Superimposed left MCA bifurcation atherosclerosis with mild to moderate left M2 origin stenoses. 6. 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 7. Patent left subclavian artery stent. Up to moderate stenosis of the left vertebral artery origin. 50% stenosis of the right subclavian artery origin due to atherosclerosis. No significant right vertebral artery stenosis. 8. Otherwise negative posterior circulation. Electronically Signed   By: Genevie Ann M.D.   On: 03/29/2016 13:57   Mr Brain Wo Contrast  Result Date:  CLINICAL DATA:  76 year old female with ELVO - right ICA and right MCA M1. EXAM: MRI HEAD WITHOUT CONTRAST TECHNIQUE: Multiplanar, multiecho pulse sequences of the brain and surrounding structures were obtained without intravenous contrast. COMPARISON:  CTA head and neck and brain CT perfusion 1256 hours today. FINDINGS: The examination had to be discontinued prior to completion due to patient difficulty with cooperation, despite sedation. Axial in  coronal diffusion weighted imaging, sagittal T1 weighted imaging, and axial T2 weighted imaging was obtained. Confluent restricted diffusion in a very similar pattern to the CT perfusion core infarct S2 meant earlier today. Confluent involvement of the right basal ganglia, insula, operculum, and anterior frontal lobe; the posterosuperior left frontal lobe and superior para rolandic cortex are relatively spared. There is also confluent involvement of the right temporal lobe, including the posterior temporal lobe and a portion of the lateral right occipital lobe. No contralateral left hemisphere or posterior fossa diffusion restriction. No midline shift. Little associated T2 hyperintensity in the affected parenchyma at this time. No evidence of associated hemorrhage. No ventriculomegaly. Basilar cisterns are patent. Normal bone marrow signal.  Negative visible cervical spine. IMPRESSION: 1. Relatively large acute right MCA territory infarct with good agreement to the earlier CT perfusion. The posterosuperior right MCA territory is relatively spared. 2. No evidence of associated hemorrhage. No significant intracranial mass effect at this time. 3. The examination had to be discontinued prior to completion despite patient sedation. Electronically Signed   By: Genevie Ann M.D.   On: 04/01/2016 16:17   Ct Cerebral Perfusion W Contrast  Result Date: 03/15/2016 CLINICAL DATA:  76 year old female found at home by family with left side weakness, facial droop. Initial encounter. EXAM: CT ANGIOGRAPHY HEAD AND NECK CT PERFUSION BRAIN TECHNIQUE: Multidetector  CT imaging of the head and neck was performed using the standard protocol during bolus administration of intravenous contrast. Multiplanar CT image reconstructions and MIPs were obtained to evaluate the vascular anatomy. Carotid stenosis measurements (when applicable) are obtained utilizing NASCET criteria, using the distal internal carotid diameter as the denominator.  Multiphase CT imaging of the brain was performed following IV bolus contrast injection. Subsequent parametric perfusion maps were calculated using RAPID software. CONTRAST:  100 mL Isovue 370 COMPARISON:  None. FINDINGS: CT Brain Perfusion Findings: CBF (<30%) Volume: 98 mL Perfusion (Tmax>6.0s) volume: 203 mL Mismatch Volume: 105 mL Infarction Location:  Right MCA territory. CTA NECK Skeleton: Prior median sternotomy. Degenerative changes in the cervical spine. Absent maxillary dentition. No acute osseous abnormality identified. Mild maxillary sinus mucosal thickening. Upper chest: Mild dependent upper lung atelectasis. No superior mediastinal lymphadenopathy. Other neck: Negative thyroid, larynx, pharynx, parapharyngeal spaces, retropharyngeal space, sublingual space, submandibular glands and parotid glands. No cervical lymphadenopathy. Aortic arch: Bovine type arch configuration with moderate soft and calcified arch atherosclerosis. Right carotid system: No brachiocephalic artery or right CCA origin despite soft and calcified plaque. Somewhat bulky calcified plaque along the proximal anterior right CCA but no associated stenosis. The right carotid bifurcation is patent with soft and calcified plaque but no associated stenosis. There is gradual loss of enhancement of the cervical right ICA distal to the bulb, such that by the skullbase there is virtually no enhancement of the vessel. Left carotid system: Bovine type left CCA origin with soft and calcified plaque but no stenosis. Medial soft and calcified plaque in the left CCA at the level of the thyroid and larynx without stenosis. Mostly calcified plaque at the left carotid bifurcation. No proximal left ICA stenosis. Tortuosity distal to the bulb. Otherwise negative cervical left ICA. Vertebral arteries: Up to 50% proximal right subclavian artery stenosis related to soft and calcified plaque. Calcified plaque at the right vertebral artery origin with only mild  stenosis (series 405, image 94). Mild right V2 and V3 segment calcified plaque without stenosis. Moderate soft and calcified plaque at the left subclavian artery origin. Just distal to the origin there is a left subclavian artery stent but remains patent. The stent terminates just at the left vertebral artery origin which is patent. There is some soft plaque superimposed at the left vert origin, with up to moderate stenosis (series 405, image 97). Calcified and tortuous left V1 segment without hemodynamically significant stenosis. Occasional left V2 and V3 segment calcification without stenosis. CTA HEAD Posterior circulation: Co dominant somewhat dolichoectatic distal vertebral arteries are without stenosis. There is mild left V4 segment calcification. Both PICA origins are patent. Patent vertebrobasilar junction. No basilar stenosis. AICA, SCA, and PCA origins are normal. Posterior communicating arteries are diminutive or absent. Bilateral PCA branches are within normal limits. Anterior circulation: No enhancement of the right ICA siphon, right ICA terminus, or right MCA origin. No right MCA M1 enhancement. There is faint reconstituted flow in a minority of the right MCA branches (series 403, image 53). The right ACA including the a 1 segment is reconstituted probably from the anterior communicating artery. 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 The ACA A2 segment and bilateral ACA branches are within normal limits. The left ICA siphon is patent with mild to moderate calcified plaque. At the anterior genu directed posteriorly there is evidence of a a 3-4 mm partially calcified ICA aneurysm best seen on series  408, image 96 there is no left ICA stenosis up to this point. There is however moderate to severe proximal left supraclinoid stenosis due to calcified plaque (same image). The left ICA terminus is patent. The left  MCA and ACA origins are normal. The left A1 segment is normal. Left MCA M1 segment is mildly irregular but without stenosis. The left MCA bifurcation is patent but with mild to moderate irregularity best seen on series 406, image 21. No left MCA branch occlusion is identified, but there is mild to moderate stenosis of several left M2 origins. Venous sinuses: Appear to be patent. Anatomic variants: Bovine type aortic arch configuration. Review of the MIP images confirms the above findings IMPRESSION: 1. 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 2. 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 3. The above was discussed by telephone with Dr. Melba Coon on 03/22/2016 at 1313 hours. 4. 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 5. Superimposed left MCA bifurcation atherosclerosis with mild to moderate left M2 origin stenoses. 6. 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 7. Patent left subclavian artery stent. Up to moderate stenosis of the left vertebral artery origin. 50% stenosis of the right subclavian artery origin due to atherosclerosis. No significant right vertebral artery stenosis. 8. Otherwise negative posterior circulation. Electronically Signed   By: Genevie Ann M.D.   On: 03/29/2016 13:57   Ct Orbits Wo Contrast  Result Date: 04/08/2016 CLINICAL DATA:  Initial evaluation for acute stroke. Right periorbital contusion. EXAM: CT ORBITS WITHOUT CONTRAST TECHNIQUE: Multidetector CT images were obtained using the standard protocol without intravenous contrast. COMPARISON:  None. FINDINGS: Orbits: Globes intact. No retro-orbital hematoma or other pathology. Extraocular muscles normal. Optic nerves within normal limits. Superior orbital veins within normal limits. Lacrimal glands normal.  Intraconal and extraconal fat preserved. Visualized sinuses: Mucosal thickening within the maxillary sinuses, left greater than right. Visualized paranasal sinuses are otherwise clear. No mastoid effusion. Bony orbits intact. Soft tissues: Right periorbital contusion. Visualized soft tissues otherwise unremarkable. Limited intracranial: Evolving right MCA territory infarct, partially visualized. Intraparenchymal hematoma centered at the anterior inferior right frontal lobe, better evaluated on corresponding head CT. IMPRESSION: 1. Large evolving right MCA territory infarct with evidence for hemorrhagic transformation in the anterior right frontal lobe, better evaluated on concomitant CT. 2. Right periorbital contusion. Otherwise unremarkable CT the orbits. Intact globes with no retro-orbital pathology. No fracture. Electronically Signed   By: Jeannine Boga M.D.   On:  03:07    Review of Systems  Unable to perform ROS: Acuity of condition   Blood pressure (!) 146/59, pulse 61, temperature 98.4 F (36.9 C), temperature source Oral, resp. rate (!) 24, weight 68 kg (150 lb), SpO2 96 %. Physical Exam  Constitutional: She is oriented to person, place, and time. She appears well-developed and well-nourished.  HENT:  Right periorbital ecchymosis  Eyes:  Pupils are 4 mm right is sluggishly reactive extraocular movements appear intact.  Neck: Normal range of motion. Neck supple.  Neurological: She is alert and oriented to person, place, and time.  Skin: Skin is warm and dry.    Assessment/Plan: Right frontal hemorrhagic transformation of ischemic stroke with moderately large intracerebral hemorrhage.  At this time patient appears to be fairly reasonably alert. Will observe her condition for any deterioration but I would  hold off on surgical intervention for the current time. Discussed this with the patient's husband  Earleen Newport 03/30/2016, 7:38 AM

## 2016-04-04 NOTE — Progress Notes (Signed)
OT Cancellation Note  Patient Details Name: Corwin LevinsSusan M Mapps MRN: [id number] DOB: 07/26/1940   Cancelled Treatment:    Reason Eval/Treat Not Completed: Patient not medically ready  [facility] Center[facility]  Zariel Capano, OT/L  [phone] 03/12/2016 03/20/2016, 7:19 AM

## 2016-04-04 NOTE — Progress Notes (Signed)
Back from CT BP   168/121 rechecked and was 207/191 manually checked on the left arm b p 180/011 at 0233.  Noted marked left side neglect.  Pt responsive and followed simple command 0310 RN noted pt had another projectile vomit with dark blood  . MD call result whiles   RN  At pt bedside suctioning pt orally. Made MD aware of change in pt's condition and therefore in a transfer to higher Level of care .

## 2016-04-04 NOTE — Progress Notes (Signed)
Patient was noted at about 12:40 AM to have periorbital ecchymosis and was hitting the right side of her head with hand and complaining of headache. Speech was mostly unintelligible. Repeat CT scan of the head was obtained which showed hemorrhagic transformation of her right MCA infarction with a large right frontal hematoma with mass effect, including a 12 mm right to left shift of the falx. Blood pressure was also elevated at 227/94. She was given labetalol 20 mg IV with a goal of achieving systolic blood pressure of 150 or less. Arrangements were made for transfer to neuro ICU for further management. Vital signs, and blood pressure were stable. She had no problems protecting airway. NIH stroke score is 22.  Patient was transferred to neurology service and to neuro intensive care unit with plans for stroke team management starting later this morning. Patient status is full code at this point.  Venetia MaxonR Lielle Vandervort M.D. [facility] [phone]

## 2016-04-04 NOTE — Plan of Care (Signed)
Had family meeting with husband, daughter, sister, brother and niece.  All the members of the family have been explained about the diagnosis, current plan and prognosis. They are in unanimous decision (as per patient wishes) that they want comfort care given the nature of the condition is serious due to devastating injury with no chance of meaningful recovery. Pt family wishes no escalation of care for now, and wait for pt son [place]rey [place] later today to start full comfort care. They requested DNR/DNI at this time. I agree with DNR and comfort care.  Marvel PlanJindong Isyss Espinal, MD PhD Stroke Neurology 10-17-2016 1:58 PM

## 2016-04-04 NOTE — Progress Notes (Signed)
STROKE TEAM PROGRESS NOTE   SUBJECTIVE (INTERVAL HISTORY) Her husband is at the bedside. Overnight, patient was complaining of a headache and had projectile vomiting. She was hypertensive at 227 systolic. Repeat CT Head revealed large evolving acute R MCA with hemorrhagic transformation. Patient was transferred to ICU for closer monitoring.   OBJECTIVE Temp:  [97.5 F (36.4 C)-100.5 F (38.1 C)] 100.5 F (38.1 C) (03/26 1200) Pulse Rate:  [43-94] 69 (03/26 1300) Cardiac Rhythm: Normal sinus rhythm (03/26 0800) Resp:  [15-38] 30 (03/26 1300) BP: (90-229)/(55-190) 142/77 (03/26 1300) SpO2:  [86 %-99 %] 96 % (03/26 1300)   Recent Labs Lab 2016/02/02 0038  GLUCAP 154*    Recent Labs Lab 03/22/2016 1007 04/02/2016 1018  NA 143 143  K 4.6 4.5  CL 108 107  CO2 26  --   GLUCOSE 101* 100*  BUN 9 11  CREATININE 0.94 0.90  CALCIUM 9.4  --     Recent Labs Lab 03/19/2016 1007  AST 21  ALT 15  ALKPHOS 54  BILITOT 0.7  PROT 6.5  ALBUMIN 3.5    Recent Labs Lab 03/22/2016 1007 03/21/2016 1018  WBC 6.8  --   NEUTROABS 4.0  --   HGB 15.0 15.0  HCT 43.8 44.0  MCV 96.3  --   PLT 128*  --     Recent Labs  03/13/2016 1007  LABPROT 13.9  INR 1.07    Recent Labs  03/25/2016 1113  COLORURINE YELLOW  LABSPEC 1.005  PHURINE 7.0  GLUCOSEU NEGATIVE  HGBUR NEGATIVE  BILIRUBINUR NEGATIVE  KETONESUR NEGATIVE  PROTEINUR NEGATIVE  NITRITE NEGATIVE  LEUKOCYTESUR NEGATIVE       Component Value Date/Time   CHOL 194 09-07-2016 0633   CHOL 140 08/27/2011 0419   TRIG 159 (H) 09-07-2016 0633   TRIG 146 08/27/2011 0419   HDL 62 09-07-2016 0633   HDL 31 (L) 08/27/2011 0419   CHOLHDL 3.1 09-07-2016 0633   VLDL 32 09-07-2016 0633   VLDL 29 08/27/2011 0419   LDLCALC 100 (H) 09-07-2016 0633   LDLCALC 80 08/27/2011 0419   No results found for: HGBA1C    Component Value Date/Time   LABOPIA NONE DETECTED 03/11/2016 1113   COCAINSCRNUR NONE DETECTED 03/27/2016 1113   LABBENZ NONE  DETECTED 04/02/2016 1113   AMPHETMU NONE DETECTED 03/27/2016 1113   THCU NONE DETECTED 03/14/2016 1113   LABBARB NONE DETECTED 03/31/2016 1113     Recent Labs Lab 03/22/2016 1007  ETH <5    I have personally reviewed the radiological images below and agree with the radiology interpretations.  Ct Angio Head W Or Wo Contrast  Result Date: 03/31/2016 CLINICAL DATA:  76 year old female found at home by family with left side weakness, facial droop. Initial encounter. EXAM: CT ANGIOGRAPHY HEAD AND NECK CT PERFUSION BRAIN TECHNIQUE: Multidetector CT imaging of the head and neck was performed using the standard protocol during bolus administration of intravenous contrast. Multiplanar CT image reconstructions and MIPs were obtained to evaluate the vascular anatomy. Carotid stenosis measurements (when applicable) are obtained utilizing NASCET criteria, using the distal internal carotid diameter as the denominator. Multiphase CT imaging of the brain was performed following IV bolus contrast injection. Subsequent parametric perfusion maps were calculated using RAPID software. CONTRAST:  100 mL Isovue 370 COMPARISON:  None. FINDINGS: CT Brain Perfusion Findings: CBF (<30%) Volume: 98 mL Perfusion (Tmax>6.0s) volume: 203 mL Mismatch Volume: 105 mL Infarction Location:  Right MCA territory. CTA NECK Skeleton: Prior median sternotomy. Degenerative changes in  the cervical spine. Absent maxillary dentition. No acute osseous abnormality identified. Mild maxillary sinus mucosal thickening. Upper chest: Mild dependent upper lung atelectasis. No superior mediastinal lymphadenopathy. Other neck: Negative thyroid, larynx, pharynx, parapharyngeal spaces, retropharyngeal space, sublingual space, submandibular glands and parotid glands. No cervical lymphadenopathy. Aortic arch: Bovine type arch configuration with moderate soft and calcified arch atherosclerosis. Right carotid system: No brachiocephalic artery or right CCA origin  despite soft and calcified plaque. Somewhat bulky calcified plaque along the proximal anterior right CCA but no associated stenosis. The right carotid bifurcation is patent with soft and calcified plaque but no associated stenosis. There is gradual loss of enhancement of the cervical right ICA distal to the bulb, such that by the skullbase there is virtually no enhancement of the vessel. Left carotid system: Bovine type left CCA origin with soft and calcified plaque but no stenosis. Medial soft and calcified plaque in the left CCA at the level of the thyroid and larynx without stenosis. Mostly calcified plaque at the left carotid bifurcation. No proximal left ICA stenosis. Tortuosity distal to the bulb. Otherwise negative cervical left ICA. Vertebral arteries: Up to 50% proximal right subclavian artery stenosis related to soft and calcified plaque. Calcified plaque at the right vertebral artery origin with only mild stenosis (series 405, image 94). Mild right V2 and V3 segment calcified plaque without stenosis. Moderate soft and calcified plaque at the left subclavian artery origin. Just distal to the origin there is a left subclavian artery stent but remains patent. The stent terminates just at the left vertebral artery origin which is patent. There is some soft plaque superimposed at the left vert origin, with up to moderate stenosis (series 405, image 97). Calcified and tortuous left V1 segment without hemodynamically significant stenosis. Occasional left V2 and V3 segment calcification without stenosis. CTA HEAD Posterior circulation: Co dominant somewhat dolichoectatic distal vertebral arteries are without stenosis. There is mild left V4 segment calcification. Both PICA origins are patent. Patent vertebrobasilar junction. No basilar stenosis. AICA, SCA, and PCA origins are normal. Posterior communicating arteries are diminutive or absent. Bilateral PCA branches are within normal limits. Anterior circulation: No  enhancement of the right ICA siphon, right ICA terminus, or right MCA origin. No right MCA M1 enhancement. There is faint reconstituted flow in a minority of the right MCA branches (series 403, image 53). The right ACA including the a 1 segment is reconstituted probably from the anterior communicating artery. 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 The ACA A2 segment and bilateral ACA branches are within normal limits. The left ICA siphon is patent with mild to moderate calcified plaque. At the anterior genu directed posteriorly there is evidence of a a 3-4 mm partially calcified ICA aneurysm best seen on series 408, image 96 there is no left ICA stenosis up to this point. There is however moderate to severe proximal left supraclinoid stenosis due to calcified plaque (same image). The left ICA terminus is patent. The left MCA and ACA origins are normal. The left A1 segment is normal. Left MCA M1 segment is mildly irregular but without stenosis. The left MCA bifurcation is patent but with mild to moderate irregularity best seen on series 406, image 21. No left MCA branch occlusion is identified, but there is mild to moderate stenosis of several left M2 origins. Venous sinuses: Appear to be patent. Anatomic variants: Bovine type aortic arch configuration. Review of the MIP images confirms  the above findings IMPRESSION: 1. 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 2. 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 3. The above was discussed by telephone with Dr. Rhona Leavens on 03/30/2016 at 1313 hours. 4. 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 5. Superimposed left MCA bifurcation atherosclerosis with mild to moderate left M2 origin stenoses. 6. 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 7. Patent left subclavian artery stent. Up to moderate stenosis of the left vertebral artery origin. 50% stenosis of the right subclavian artery origin due to atherosclerosis. No significant right vertebral artery stenosis. 8. Otherwise negative posterior circulation. Electronically Signed   By: Odessa Fleming M.D.   On:  13:57   Ct Head Wo Contrast  Addendum Date: 04/30/16   ADDENDUM REPORT: 04-30-16 08:19 ADDENDUM: Upon further consideration, in regards to the right frontal intraparenchymal hematoma, an alternate consideration other than hemorrhagic transformation of infarct would also be an acute bleed from the previously identified small anterior communicating artery aneurysm, particularly given the location of the hematoma. These additional results were called by telephone at the time of interpretation on Apr 30, 2016 at 8:02 am to Dr. Karlene Lineman , who verbally acknowledged these results. Electronically Signed   By: Rise Mu M.D.   On: Apr 30, 2016 08:19   Result Date: 30-Apr-2016 CLINICAL DATA:  Initial evaluation for acute headache, stroke. EXAM: CT HEAD WITHOUT CONTRAST TECHNIQUE: Contiguous axial images were obtained from the base of the skull through the vertex without intravenous contrast. COMPARISON:  Prior studies from 03/12/2016. FINDINGS: Brain: There has been interval development of large intraparenchymal hematoma centered at the parasagittal anterior/inferior right frontal lobe. Hemorrhage measures 6.0 x 4.1 x 4.7 cm (estimated volume 57.8 mL). Localized mass effect with right-to-left shift of the anterior falx up to 12 mm. Extensive cytotoxic edema consistent with large evolving right MCA territory infarct, overall similar in distribution relative to previous MRI. Probable increased mass effect. Right lateral ventricle of face. No hydrocephalus. No other acute intracranial hemorrhage.  No other acute large vessel territory infarct. No extra-axial fluid collection. Vascular: Residual hyperdensity within the right MCA, likely reflecting thrombus as seen on prior CTA. Skull: Right periorbital contusion. Scalp soft tissues otherwise unremarkable. Calvarium intact. Sinuses/Orbits: Right periorbital contusion. Globes intact. Orbital soft tissues within normal limits. Scattered mucosal thickening within the maxillary sinuses bilaterally. Paranasal sinuses are clear. No mastoid effusion. Other: No other significant finding. IMPRESSION: 1. 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 Hemorrhagic transformation with new 6.0 x 4.1 x 4.7 cm intraparenchymal hematoma positioned at the parasagittal anterior/inferior right frontal lobe. Localized shift of the anterior falx up to 12 mm. 2. Right periorbital contusion. Critical Value/emergent results were called by telephone at the time of interpretation on 2016/04/30 at 2:44 am to Dr. Arnetha Courser , who verbally acknowledged these results. Electronically Signed: By: Rise Mu M.D. On: 04-30-2016 02:54   Ct Head Wo Contrast  Addendum Date:    ADDENDUM REPORT: 03/30/2016 13:20 ADDENDUM: On further review, there is hyperdensity in the right middle cerebral artery with subtle decreased attenuation in the right middle cerebral artery distribution. Dr. Margo Aye, neuroradiology, discussed the case with the on-call neurologist. Electronically Signed   By: Bretta Bang III M.D.   On: 03/21/2016 13:20   Result Date:  CLINICAL DATA:  Left-sided weakness EXAM:  CT HEAD WITHOUT CONTRAST TECHNIQUE: Contiguous axial images were obtained from the base of the skull through the vertex without intravenous contrast. COMPARISON:  Head CT August 06, 2014 and brain MRI August 07, 2014 FINDINGS: Brain: There is age related volume loss. There is no intracranial mass, hemorrhage, extra-axial fluid collection, or midline shift. There is evidence of a  prior small infarct in the posterior left lentiform nucleus. There is patchy small vessel disease in the centra semiovale bilaterally. No new gray-white compartment lesion is evident. No acute infarct appreciable. Vascular: No hyperdense vessel. There is calcification in each carotid siphon region. There is also calcification in the distal left vertebral artery. Skull: The bony calvarium appears intact. Sinuses/Orbits: There is mucosal thickening in both maxillary antra, considerably more on the left than on the right. There is mucosal thickening in several ethmoid air cells bilaterally. Other paranasal sinuses are clear. No intraorbital lesions are evident. Other: The mastoid air cells are clear. IMPRESSION: Age related volume loss. Patchy periventricular small vessel disease, stable. Prior small lacunar infarct posterior left lentiform nucleus, stable. No intracranial mass, hemorrhage, or extra-axial fluid collection. Several foci of arterial vascular calcification noted. Paranasal sinus disease noted in several areas, most severe in the left maxillary antrum. Electronically Signed: By: Bretta Bang III M.D. On: 03/11/2016 10:37   Ct Angio Neck W Or Wo Contrast  Result Date: 03/28/2016 CLINICAL DATA:  76 year old female found at home by family with left side weakness, facial droop. Initial encounter. EXAM: CT ANGIOGRAPHY HEAD AND NECK CT PERFUSION BRAIN TECHNIQUE: Multidetector CT imaging of the head and neck was performed using the standard protocol during bolus administration of intravenous contrast. Multiplanar CT image reconstructions and MIPs were obtained to evaluate the vascular anatomy. Carotid stenosis measurements (when applicable) are obtained utilizing NASCET criteria, using the distal internal carotid diameter as the denominator. Multiphase CT imaging of the brain was performed following IV bolus contrast injection. Subsequent parametric perfusion maps were calculated using RAPID software.  CONTRAST:  100 mL Isovue 370 COMPARISON:  None. FINDINGS: CT Brain Perfusion Findings: CBF (<30%) Volume: 98 mL Perfusion (Tmax>6.0s) volume: 203 mL Mismatch Volume: 105 mL Infarction Location:  Right MCA territory. CTA NECK Skeleton: Prior median sternotomy. Degenerative changes in the cervical spine. Absent maxillary dentition. No acute osseous abnormality identified. Mild maxillary sinus mucosal thickening. Upper chest: Mild dependent upper lung atelectasis. No superior mediastinal lymphadenopathy. Other neck: Negative thyroid, larynx, pharynx, parapharyngeal spaces, retropharyngeal space, sublingual space, submandibular glands and parotid glands. No cervical lymphadenopathy. Aortic arch: Bovine type arch configuration with moderate soft and calcified arch atherosclerosis. Right carotid system: No brachiocephalic artery or right CCA origin despite soft and calcified plaque. Somewhat bulky calcified plaque along the proximal anterior right CCA but no associated stenosis. The right carotid bifurcation is patent with soft and calcified plaque but no associated stenosis. There is gradual loss of enhancement of the cervical right ICA distal to the bulb, such that by the skullbase there is virtually no enhancement of the vessel. Left carotid system: Bovine type left CCA origin with soft and calcified plaque but no stenosis. Medial soft and calcified plaque in the left CCA at the level of the thyroid and larynx without stenosis. Mostly calcified plaque at the left carotid bifurcation. No proximal left ICA stenosis. Tortuosity distal to the bulb. Otherwise negative cervical left ICA. Vertebral arteries: Up to 50% proximal right subclavian artery stenosis related to soft and calcified plaque. Calcified plaque at the right vertebral artery origin with only  mild stenosis (series 405, image 94). Mild right V2 and V3 segment calcified plaque without stenosis. Moderate soft and calcified plaque at the left subclavian artery  origin. Just distal to the origin there is a left subclavian artery stent but remains patent. The stent terminates just at the left vertebral artery origin which is patent. There is some soft plaque superimposed at the left vert origin, with up to moderate stenosis (series 405, image 97). Calcified and tortuous left V1 segment without hemodynamically significant stenosis. Occasional left V2 and V3 segment calcification without stenosis. CTA HEAD Posterior circulation: Co dominant somewhat dolichoectatic distal vertebral arteries are without stenosis. There is mild left V4 segment calcification. Both PICA origins are patent. Patent vertebrobasilar junction. No basilar stenosis. AICA, SCA, and PCA origins are normal. Posterior communicating arteries are diminutive or absent. Bilateral PCA branches are within normal limits. Anterior circulation: No enhancement of the right ICA siphon, right ICA terminus, or right MCA origin. No right MCA M1 enhancement. There is faint reconstituted flow in a minority of the right MCA branches (series 403, image 53). The right ACA including the a 1 segment is reconstituted probably from the anterior communicating artery. 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 The ACA A2 segment and bilateral ACA branches are within normal limits. The left ICA siphon is patent with mild to moderate calcified plaque. At the anterior genu directed posteriorly there is evidence of a a 3-4 mm partially calcified ICA aneurysm best seen on series 408, image 96 there is no left ICA stenosis up to this point. There is however moderate to severe proximal left supraclinoid stenosis due to calcified plaque (same image). The left ICA terminus is patent. The left MCA and ACA origins are normal. The left A1 segment is normal. Left MCA M1 segment is mildly irregular but without stenosis. The left MCA bifurcation is patent  but with mild to moderate irregularity best seen on series 406, image 21. No left MCA branch occlusion is identified, but there is mild to moderate stenosis of several left M2 origins. Venous sinuses: Appear to be patent. Anatomic variants: Bovine type aortic arch configuration. Review of the MIP images confirms the above findings IMPRESSION: 1. 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 2. 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 3. The above was discussed by telephone with Dr. Rhona Leavens on 03/14/2016 at 1313 hours. 4. 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 5. Superimposed left MCA bifurcation atherosclerosis with mild to moderate left M2 origin stenoses. 6. 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 7. Patent left subclavian artery stent. Up to moderate stenosis of the left vertebral artery origin. 50% stenosis of the right subclavian artery origin due to atherosclerosis. No significant right vertebral artery stenosis. 8. Otherwise negative posterior circulation. Electronically Signed   By: Odessa Fleming M.D.   On: 03/13/2016 13:57   Mr Brain Wo Contrast  Result Date: 03/30/2016 CLINICAL DATA:  76 year old female with ELVO - right ICA and right MCA M1. EXAM: MRI HEAD WITHOUT CONTRAST TECHNIQUE: Multiplanar, multiecho pulse sequences of the brain and surrounding structures were obtained without intravenous contrast. COMPARISON:  CTA head and neck and brain CT perfusion 1256 hours today. FINDINGS: The examination had to be discontinued prior  to completion due to patient difficulty with cooperation, despite sedation. Axial in coronal diffusion weighted imaging, sagittal T1 weighted imaging, and axial T2 weighted imaging was obtained. Confluent restricted diffusion in a very similar  pattern to the CT perfusion core infarct S2 meant earlier today. Confluent involvement of the right basal ganglia, insula, operculum, and anterior frontal lobe; the posterosuperior left frontal lobe and superior para rolandic cortex are relatively spared. There is also confluent involvement of the right temporal lobe, including the posterior temporal lobe and a portion of the lateral right occipital lobe. No contralateral left hemisphere or posterior fossa diffusion restriction. No midline shift. Little associated T2 hyperintensity in the affected parenchyma at this time. No evidence of associated hemorrhage. No ventriculomegaly. Basilar cisterns are patent. Normal bone marrow signal.  Negative visible cervical spine. IMPRESSION: 1. Relatively large acute right MCA territory infarct with good agreement to the earlier CT perfusion. The posterosuperior right MCA territory is relatively spared. 2. No evidence of associated hemorrhage. No significant intracranial mass effect at this time. 3. The examination had to be discontinued prior to completion despite patient sedation. Electronically Signed   By: Odessa Fleming M.D.   On: 04/29/2016 16:17   Ct Cerebral Perfusion W Contrast  Result Date: 04/29/16 CLINICAL DATA:  76 year old female found at home by family with left side weakness, facial droop. Initial encounter. EXAM: CT ANGIOGRAPHY HEAD AND NECK CT PERFUSION BRAIN TECHNIQUE: Multidetector CT imaging of the head and neck was performed using the standard protocol during bolus administration of intravenous contrast. Multiplanar CT image reconstructions and MIPs were obtained to evaluate the vascular anatomy. Carotid stenosis measurements (when applicable) are obtained utilizing NASCET criteria, using the distal internal carotid diameter as the denominator. Multiphase CT imaging of the brain was performed following IV bolus contrast injection. Subsequent parametric perfusion maps were calculated using RAPID software.  CONTRAST:  100 mL Isovue 370 COMPARISON:  None. FINDINGS: CT Brain Perfusion Findings: CBF (<30%) Volume: 98 mL Perfusion (Tmax>6.0s) volume: 203 mL Mismatch Volume: 105 mL Infarction Location:  Right MCA territory. CTA NECK Skeleton: Prior median sternotomy. Degenerative changes in the cervical spine. Absent maxillary dentition. No acute osseous abnormality identified. Mild maxillary sinus mucosal thickening. Upper chest: Mild dependent upper lung atelectasis. No superior mediastinal lymphadenopathy. Other neck: Negative thyroid, larynx, pharynx, parapharyngeal spaces, retropharyngeal space, sublingual space, submandibular glands and parotid glands. No cervical lymphadenopathy. Aortic arch: Bovine type arch configuration with moderate soft and calcified arch atherosclerosis. Right carotid system: No brachiocephalic artery or right CCA origin despite soft and calcified plaque. Somewhat bulky calcified plaque along the proximal anterior right CCA but no associated stenosis. The right carotid bifurcation is patent with soft and calcified plaque but no associated stenosis. There is gradual loss of enhancement of the cervical right ICA distal to the bulb, such that by the skullbase there is virtually no enhancement of the vessel. Left carotid system: Bovine type left CCA origin with soft and calcified plaque but no stenosis. Medial soft and calcified plaque in the left CCA at the level of the thyroid and larynx without stenosis. Mostly calcified plaque at the left carotid bifurcation. No proximal left ICA stenosis. Tortuosity distal to the bulb. Otherwise negative cervical left ICA. Vertebral arteries: Up to 50% proximal right subclavian artery stenosis related to soft and calcified plaque. Calcified plaque at the right vertebral artery origin with only mild stenosis (series 405, image 94). Mild right V2 and V3 segment calcified plaque without stenosis. Moderate soft and calcified plaque at  the left subclavian artery  origin. Just distal to the origin there is a left subclavian artery stent but remains patent. The stent terminates just at the left vertebral artery origin which is patent. There is some soft plaque superimposed at the left vert origin, with up to moderate stenosis (series 405, image 97). Calcified and tortuous left V1 segment without hemodynamically significant stenosis. Occasional left V2 and V3 segment calcification without stenosis. CTA HEAD Posterior circulation: Co dominant somewhat dolichoectatic distal vertebral arteries are without stenosis. There is mild left V4 segment calcification. Both PICA origins are patent. Patent vertebrobasilar junction. No basilar stenosis. AICA, SCA, and PCA origins are normal. Posterior communicating arteries are diminutive or absent. Bilateral PCA branches are within normal limits. Anterior circulation: No enhancement of the right ICA siphon, right ICA terminus, or right MCA origin. No right MCA M1 enhancement. There is faint reconstituted flow in a minority of the right MCA branches (series 403, image 53). The right ACA including the a 1 segment is reconstituted probably from the anterior communicating artery. 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 The ACA A2 segment and bilateral ACA branches are within normal limits. The left ICA siphon is patent with mild to moderate calcified plaque. At the anterior genu directed posteriorly there is evidence of a a 3-4 mm partially calcified ICA aneurysm best seen on series 408, image 96 there is no left ICA stenosis up to this point. There is however moderate to severe proximal left supraclinoid stenosis due to calcified plaque (same image). The left ICA terminus is patent. The left MCA and ACA origins are normal. The left A1 segment is normal. Left MCA M1 segment is mildly irregular but without stenosis. The left MCA bifurcation is patent  but with mild to moderate irregularity best seen on series 406, image 21. No left MCA branch occlusion is identified, but there is mild to moderate stenosis of several left M2 origins. Venous sinuses: Appear to be patent. Anatomic variants: Bovine type aortic arch configuration. Review of the MIP images confirms the above findings IMPRESSION: 1. 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 2. 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 3. The above was discussed by telephone with Dr. Rhona Leavens on 03/18/2016 at 1313 hours. 4. 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 5. Superimposed left MCA bifurcation atherosclerosis with mild to moderate left M2 origin stenoses. 6. 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 7. Patent left subclavian artery stent. Up to moderate stenosis of the left vertebral artery origin. 50% stenosis of the right subclavian artery origin due to atherosclerosis. No significant right vertebral artery stenosis. 8. Otherwise negative posterior circulation. Electronically Signed   By: Odessa Fleming M.D.   On: 03/10/2016 13:57   Ct Orbits Wo Contrast  Result Date: 18-Apr-2016 CLINICAL DATA:  Initial evaluation for acute stroke. Right periorbital contusion. EXAM: CT ORBITS WITHOUT CONTRAST TECHNIQUE: Multidetector CT images were obtained using the standard protocol without intravenous contrast. COMPARISON:  None. FINDINGS: Orbits: Globes intact. No retro-orbital hematoma or other pathology. Extraocular muscles normal. Optic nerves within normal limits. Superior orbital veins within normal limits. Lacrimal glands normal. Intraconal and extraconal fat preserved. Visualized sinuses: Mucosal thickening within the maxillary sinuses, left greater than right. Visualized paranasal  sinuses  are otherwise clear. No mastoid effusion. Bony orbits intact. Soft tissues: Right periorbital contusion. Visualized soft tissues otherwise unremarkable. Limited intracranial: Evolving right MCA territory infarct, partially visualized. Intraparenchymal hematoma centered at the anterior inferior right frontal lobe, better evaluated on corresponding head CT. IMPRESSION: 1. Large evolving right MCA territory infarct with evidence for hemorrhagic transformation in the anterior right frontal lobe, better evaluated on concomitant CT. 2. Right periorbital contusion. Otherwise unremarkable CT the orbits. Intact globes with no retro-orbital pathology. No fracture. Electronically Signed   By: Rise Mu M.D.   On: 2016/04/28 03:07   2D Echocardiogram : pending  EKG  normal sinus rhythm. For complete results please see formal report.  PHYSICAL EXAM Vitals:   04/28/2016 1130 April 28, 2016 1200 2016/04/28 1230 04/28/16 1300  BP: (!) 154/58 (!) 150/63 103/86 (!) 142/77  Pulse: 69 70 66 69  Resp: (!) 38 (!) 30 (!) 29 (!) 30  Temp:  (!) 100.5 F (38.1 C)    TempSrc:  Axillary    SpO2: 97% 97% 97% 96%  Weight:       General: Vital signs reviewed.  Patient is elderly, in no acute distress Head: Large right periorbital ecchymosis.  Eyes: Conjunctivae normal, no scleral icterus, PERRL.  Neck: Supple, trachea midline, no carotid bruit present.  Cardiovascular: RRR Pulmonary/Chest: Clear to auscultation bilaterally, no wheezes, rales, or rhonchi. Abdominal: Soft, non-tender, non-distended, hypoactive BS  Extremities: No lower extremity edema bilaterally, pulses symmetric and intact bilaterally.   Mental Status -  Lethargic, minimally arousable  Cranial Nerves II - XII -unable to fully assess due to mental status. Does not follow commands, does not open eyes to auditory commands or painful stimuli. PERRL, sluggish. Absent corneal reflex. +Babinski on left, absent on right. Flaccid LUE and LLE. Moves RUE  spontaneously. Unable to test gait and station.   ASSESSMENT/PLAN Ms. ADRYANNA FRIEDT is a 76 y.o. female with history of CVA, CAD, HTN, HLD, and COPD admitted with acute large R MCA CVA, now with hemorrhagic transformation and 12 mm right to left shift. Symptoms left sided weakness.     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 The posterosuperior right MCA territory is relatively spared. No evidence of associated hemorrhage. No significant intracranial mass effect at this time.  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   2D Echo pending  LDL 100  HgbA1c pending  SCDs for VTE prophylaxis  Diet NPO time specified   aspirin 81 mg daily and clopidogrel 75 mg daily prior to admission, now on No antithrombotic  Ongoing aggressive stroke risk factor management  Therapy recommendations:  pending  Disposition:  Pending, poor prognosis, family is leaning towards comfort care- DNR for now, possible transition to comfort care later today  Diabetes  HgbA1c pending goal < 7.0  CBG monitoring  Hypertension  Home meds:  Carvedilol 25 mg BID, losartan 100 mg Given hemorrhagic transformation, started hydralazine 20 mg Q2H prn and nicardipine for SBP goal 150.   Unstable, labile  Hyperlipidemia  Home meds:  Atorvastatin 20 mg QD   Currently on atorvastatin 80 mg QD  LDL 100, goal < 70  Continue statin at discharge  Other Stroke Risk Factors  Advanced age  Tobacco abuse  ETOH use  Hx stroke/TIA  Coronary artery disease  Hospital day # 1  Karlene Lineman, DO PGY-3 Internal Medicine Resident 04/12/2016 1:50 PM  To contact Stroke Continuity provider, please refer to [email or web address]. After hours, contact General Neurology

## 2016-04-04 NOTE — Plan of Care (Signed)
Problem: Tissue Perfusion: Goal: Complications of Ischemic Stroke will be minimized (choose ONE based on patient diagnosis) Outcome: Completed/Met Date Met: 03/24/2016 Increrased HA, vomiting. CT showed hemorrage.

## 2016-04-04 NOTE — Progress Notes (Signed)
Patient transferred to [facility] 10 report call to Asher MuirJamie,  Family member notified Spoke to pt's husband Mr  Sabino DonovanBarnum and made aware of the transfer.

## 2016-04-04 NOTE — Progress Notes (Signed)
Spoke with Dr. Roseanne RenoStewart in regards to patient's hypertension and bradycardia. Per MD we are to monitor heart rate at this time and give 10 mg of hydralazine IV for hypertension. Goal systolic blood pressure is less than 150 per Dr. Roseanne RenoStewart. MD also stated to administer another 10 mg of hydralazine in an hour if systolic blood pressure continues to be above 150, and then place order for 20 mg of hydralazine Q2Hprn for systolic blood pressure above 161150.

## 2016-04-05 LAB — HEMOGLOBIN A1C
Hgb A1c MFr Bld: 5.5 % (ref 4.8–5.6)
Mean Plasma Glucose: 111 mg/dL

## 2016-04-10 NOTE — Progress Notes (Signed)
Time of death 2314. Family at bedside and aware. Dr. Otelia LimesLindzen paged and notified to fill out death certificate. Family undecided on nursing home and given patient placement card.

## 2016-04-10 NOTE — Progress Notes (Signed)
250 mL of morphine wasted in sink with Antonieta IbaSavannah Jenkins, RN.

## 2016-04-10 NOTE — Death Summary Note (Signed)
Stroke Discharge Summary  Patient ID: Victoria Mccarty   MRN: [id number]      DOB: 07-03-1940  Date of Admission: 03/29/2016 Date of Discharge: 03/18/2016  Attending Physician:  No att. providers found, Stroke MD Consultant(s):   Treatment Team:  Kristeen Miss, MD - neurosurgery  Patient's PCP:  Kirk Ruths., MD  DISCHARGE DIAGNOSIS:  Active Problems:   Acute ischemic stroke [facility]) - right MCA large infarct   Anterior cerebral circulation hemorrhagic infarction ([place]) - right frontal hematoma   Cerebral edema   BMI: Body mass index is 22.79 kg/m.  Past Medical History:  Diagnosis Date  . Bipolar 1 disorder ([place])   . Coronary artery disease   . Depression   . Hyperlipemia   . Hypertension   . Kidney cysts    Right  . Low back pain   . Myocardial infarction   . Osteoarthritis   . Psychosis   . SOB (shortness of breath) on exertion   . Stroke [facility])    3 years ago no residual effect  . TIA (transient ischemic attack)    Past Surgical History:  Procedure Laterality Date  . ABDOMINAL HYSTERECTOMY    . BLEPHAROPLASTY Bilateral   . CARDIAC SURGERY    . CORONARY ARTERY BYPASS GRAFT     6 vessels 8 years ago  . PERIPHERAL VASCULAR CATHETERIZATION Bilateral 08/28/2014   Procedure: Endovascular Repair/Stent Graft;  Surgeon: Algernon Huxley, MD;  Location: [place] [facility];  Service: Cardiovascular;  Laterality: Bilateral;  . SUBCLAVIAN STENT PLACEMENT Left   . tumor resection kidney Right     Allergies as of 04/17/2016   No Known Allergies     Medication List    ASK your doctor about these medications   acetaminophen 325 MG tablet Commonly known as:  TYLENOL One to two tabs by mouth every six hours as needed for mild pain   aspirin 81 MG EC tablet Take 1 tablet (81 mg total) by mouth daily.   atorvastatin 20 MG tablet Commonly known as:  LIPITOR Take 20 mg by mouth daily.   buPROPion 300 MG 24 hr tablet Commonly known as:  WELLBUTRIN XL Take 300 mg by  mouth daily.   carvedilol 25 MG tablet Commonly known as:  COREG Take 25 mg by mouth 2 (two) times daily.   cholecalciferol 1000 units tablet Commonly known as:  VITAMIN D Take 1,000 Units by mouth daily.   clopidogrel 75 MG tablet Commonly known as:  PLAVIX Take 75 mg by mouth at bedtime.   folic acid 798 MCG tablet Commonly known as:  FOLVITE Take 800 mcg by mouth daily.   KRILL OIL PO Take 1 capsule by mouth daily.   losartan 100 MG tablet Commonly known as:  COZAAR Take 100 mg by mouth daily.   nitroGLYCERIN 0.4 MG SL tablet Commonly known as:  NITROSTAT Place under the tongue.   QUEtiapine 300 MG tablet Commonly known as:  SEROQUEL Take 300 mg by mouth at bedtime.   QUEtiapine 300 MG 24 hr tablet Commonly known as:  SEROQUEL XR Take 300 mg by mouth at bedtime.   QUEtiapine 100 MG tablet Commonly known as:  SEROQUEL Take 100 mg by mouth daily.   VITAMIN B COMPLEX PO Take 1 tablet by mouth daily.       LABORATORY STUDIES CBC    Component Value Date/Time   WBC 6.8 04/06/2016 1007   RBC 4.55 03/28/2016 1007   HGB 15.0  03/27/2016 1018   HGB 13.6 08/27/2011 0419   HCT 44.0 04/02/2016 1018   HCT 40.9 08/27/2011 0419   PLT 128 (L) 03/19/2016 1007   PLT 154 08/27/2011 0419   MCV 96.3 04/07/2016 1007   MCV 91 08/27/2011 0419   MCH 33.0 03/22/2016 1007   MCHC 34.2 03/27/2016 1007   RDW 13.4 04/02/2016 1007   RDW 15.1 (H) 08/27/2011 0419   LYMPHSABS 1.6 04/02/2016 1007   LYMPHSABS 3.1 08/27/2011 0419   MONOABS 1.0 03/22/2016 1007   MONOABS 0.9 08/27/2011 0419   EOSABS 0.2 04/09/2016 1007   EOSABS 0.2 08/27/2011 0419   BASOSABS 0.0 03/10/2016 1007   BASOSABS 0.1 08/27/2011 0419   CMP    Component Value Date/Time   NA 143 03/21/2016 1018   NA 143 08/27/2011 0419   K 4.5 03/31/2016 1018   K 3.5 08/27/2011 0419   CL 107 03/27/2016 1018   CL 108 (H) 08/27/2011 0419   CO2 26 03/21/2016 1007   CO2 25 08/27/2011 0419   GLUCOSE 100 (H) 03/30/2016  1018   GLUCOSE 94 08/27/2011 0419   BUN 11 03/10/2016 1018   BUN 15 10/06/2011 0828   CREATININE 0.90 03/31/2016 1018   CREATININE 0.89 10/06/2011 0828   CALCIUM 9.4 03/17/2016 1007   CALCIUM 8.7 08/27/2011 0419   PROT 6.5 04/08/2016 1007   PROT 7.7 08/26/2011 1313   ALBUMIN 3.5 03/24/2016 1007   ALBUMIN 3.3 (L) 08/26/2011 1313   AST 21 03/12/2016 1007   AST 39 (H) 08/26/2011 1313   ALT 15 03/22/2016 1007   ALT 28 08/26/2011 1313   ALKPHOS 54 03/14/2016 1007   ALKPHOS 99 08/26/2011 1313   BILITOT 0.7 03/11/2016 1007   BILITOT 0.6 08/26/2011 1313   GFRNONAA 58 (L) 03/24/2016 1007   GFRNONAA >60 10/06/2011 0828   GFRAA >60 04/01/2016 1007   GFRAA >60 10/06/2011 0828   COAGS Lab Results  Component Value Date   INR 1.07    INR 1.03 08/21/2014   INR 1.00 08/06/2014   Lipid Panel    Component Value Date/Time   CHOL 194  0633   CHOL 140 08/27/2011 0419   TRIG 159 (H)  0633   TRIG 146 08/27/2011 0419   HDL 62  0633   HDL 31 (L) 08/27/2011 0419   CHOLHDL 3.1  0633   VLDL 32  0633   VLDL 29 08/27/2011 0419   LDLCALC 100 (H)  0633   LDLCALC 80 08/27/2011 0419   HgbA1C  Lab Results  Component Value Date   HGBA1C 5.5    Urinalysis    Component Value Date/Time   COLORURINE YELLOW 03/24/2016 1113   APPEARANCEUR CLEAR 04/08/2016 1113   APPEARANCEUR Hazy 08/26/2011 1413   LABSPEC 1.005 03/20/2016 1113   LABSPEC 1.012 08/26/2011 1413   PHURINE 7.0 03/25/2016 1113   GLUCOSEU NEGATIVE 03/20/2016 1113   GLUCOSEU Negative 08/26/2011 1413   HGBUR NEGATIVE 03/16/2016 1113   BILIRUBINUR NEGATIVE 03/27/2016 1113   BILIRUBINUR Negative 08/26/2011 1413   KETONESUR NEGATIVE 03/12/2016 1113   PROTEINUR NEGATIVE 03/27/2016 1113   NITRITE NEGATIVE 03/24/2016 1113   LEUKOCYTESUR NEGATIVE 03/20/2016 1113   LEUKOCYTESUR Negative 08/26/2011 1413   Urine Drug Screen     Component Value Date/Time    LABOPIA NONE DETECTED 04/01/2016 1113   COCAINSCRNUR NONE DETECTED  1113   LABBENZ NONE DETECTED 03/17/2016 1113   AMPHETMU NONE DETECTED 03/25/2016 1113   THCU NONE DETECTED 03/21/2016 1113   LABBARB NONE  DETECTED 03/16/2016 1113    Alcohol Level    Component Value Date/Time   ETH <5 03/25/2016 1007     SIGNIFICANT DIAGNOSTIC STUDIES Ct Angio Head W Or Wo Contrast  Result Date: 03/14/2016 CLINICAL DATA:  76 year old female found at home by family with left side weakness, facial droop. Initial encounter. EXAM: CT ANGIOGRAPHY HEAD AND NECK CT PERFUSION BRAIN TECHNIQUE: Multidetector CT imaging of the head and neck was performed using the standard protocol during bolus administration of intravenous contrast. Multiplanar CT image reconstructions and MIPs were obtained to evaluate the vascular anatomy. Carotid stenosis measurements (when applicable) are obtained utilizing NASCET criteria, using the distal internal carotid diameter as the denominator. Multiphase CT imaging of the brain was performed following IV bolus contrast injection. Subsequent parametric perfusion maps were calculated using RAPID software. CONTRAST:  100 mL Isovue 370 COMPARISON:  None. FINDINGS: CT Brain Perfusion Findings: CBF (<30%) Volume: 98 mL Perfusion (Tmax>6.0s) volume: 203 mL Mismatch Volume: 105 mL Infarction Location:  Right MCA territory. CTA NECK Skeleton: Prior median sternotomy. Degenerative changes in the cervical spine. Absent maxillary dentition. No acute osseous abnormality identified. Mild maxillary sinus mucosal thickening. Upper chest: Mild dependent upper lung atelectasis. No superior mediastinal lymphadenopathy. Other neck: Negative thyroid, larynx, pharynx, parapharyngeal spaces, retropharyngeal space, sublingual space, submandibular glands and parotid glands. No cervical lymphadenopathy. Aortic arch: Bovine type arch configuration with moderate soft and calcified arch atherosclerosis. Right  carotid system: No brachiocephalic artery or right CCA origin despite soft and calcified plaque. Somewhat bulky calcified plaque along the proximal anterior right CCA but no associated stenosis. The right carotid bifurcation is patent with soft and calcified plaque but no associated stenosis. There is gradual loss of enhancement of the cervical right ICA distal to the bulb, such that by the skullbase there is virtually no enhancement of the vessel. Left carotid system: Bovine type left CCA origin with soft and calcified plaque but no stenosis. Medial soft and calcified plaque in the left CCA at the level of the thyroid and larynx without stenosis. Mostly calcified plaque at the left carotid bifurcation. No proximal left ICA stenosis. Tortuosity distal to the bulb. Otherwise negative cervical left ICA. Vertebral arteries: Up to 50% proximal right subclavian artery stenosis related to soft and calcified plaque. Calcified plaque at the right vertebral artery origin with only mild stenosis (series 405, image 94). Mild right V2 and V3 segment calcified plaque without stenosis. Moderate soft and calcified plaque at the left subclavian artery origin. Just distal to the origin there is a left subclavian artery stent but remains patent. The stent terminates just at the left vertebral artery origin which is patent. There is some soft plaque superimposed at the left vert origin, with up to moderate stenosis (series 405, image 97). Calcified and tortuous left V1 segment without hemodynamically significant stenosis. Occasional left V2 and V3 segment calcification without stenosis. CTA HEAD Posterior circulation: Co dominant somewhat dolichoectatic distal vertebral arteries are without stenosis. There is mild left V4 segment calcification. Both PICA origins are patent. Patent vertebrobasilar junction. No basilar stenosis. AICA, SCA, and PCA origins are normal. Posterior communicating arteries are diminutive or absent. Bilateral  PCA branches are within normal limits. Anterior circulation: No enhancement of the right ICA siphon, right ICA terminus, or right MCA origin. No right MCA M1 enhancement. There is faint reconstituted flow in a minority of the right MCA branches (series 403, image 53). The right ACA including the a 1 segment is reconstituted probably from the anterior  communicating artery. 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 The ACA A2 segment and bilateral ACA branches are within normal limits. The left ICA siphon is patent with mild to moderate calcified plaque. At the anterior genu directed posteriorly there is evidence of a a 3-4 mm partially calcified ICA aneurysm best seen on series 408, image 96 there is no left ICA stenosis up to this point. There is however moderate to severe proximal left supraclinoid stenosis due to calcified plaque (same image). The left ICA terminus is patent. The left MCA and ACA origins are normal. The left A1 segment is normal. Left MCA M1 segment is mildly irregular but without stenosis. The left MCA bifurcation is patent but with mild to moderate irregularity best seen on series 406, image 21. No left MCA branch occlusion is identified, but there is mild to moderate stenosis of several left M2 origins. Venous sinuses: Appear to be patent. Anatomic variants: Bovine type aortic arch configuration. Review of the MIP images confirms the above findings IMPRESSION: 1. 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 2. 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 3. The above was discussed by telephone with Dr. Melba Coon on 03/10/2016 at 1313 hours. 4. 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 5. Superimposed left MCA bifurcation atherosclerosis with mild to moderate  left M2 origin stenoses. 6. 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 7. Patent left subclavian artery stent. Up to moderate stenosis of the left vertebral artery origin. 50% stenosis of the right subclavian artery origin due to atherosclerosis. No significant right vertebral artery stenosis. 8. Otherwise negative posterior circulation. Electronically Signed   By: Genevie Ann M.D.   On: 03/14/2016 13:57   Ct Head Wo Contrast  Addendum Date: 04/08/2016   ADDENDUM REPORT: 03/28/2016 08:19 ADDENDUM: Upon further consideration, in regards to the right frontal intraparenchymal hematoma, an alternate consideration other than hemorrhagic transformation of infarct would also be an acute bleed from the previously identified small anterior communicating artery aneurysm, particularly given the location of the hematoma. These additional results were called by telephone at the time of interpretation on 04/01/2016 at 8:02 am to Dr. Martyn Malay , who verbally acknowledged these results. Electronically Signed   By: Jeannine Boga M.D.   On: 03/24/2016 08:19   Result Date:  CLINICAL DATA:  Initial evaluation for acute headache, stroke. EXAM: CT HEAD WITHOUT CONTRAST TECHNIQUE: Contiguous axial images were obtained from the base of the skull through the vertex without intravenous contrast. COMPARISON:  Prior studies from 03/23/2016. FINDINGS: Brain: There has been interval development of large intraparenchymal hematoma centered at the parasagittal anterior/inferior right frontal lobe. Hemorrhage measures 6.0 x 4.1 x 4.7 cm (estimated volume 57.8 mL). Localized mass effect with right-to-left shift of the anterior falx up to 12 mm. Extensive cytotoxic edema consistent with large evolving right MCA territory infarct, overall similar in distribution relative to previous MRI. Probable increased mass effect. Right lateral ventricle of  face. No hydrocephalus. No other acute intracranial hemorrhage. No other acute large vessel territory infarct. No extra-axial fluid collection. Vascular: Residual hyperdensity within the right MCA, likely reflecting thrombus as seen on prior CTA. Skull: Right periorbital contusion. Scalp soft tissues otherwise unremarkable. Calvarium intact. Sinuses/Orbits: Right periorbital contusion. Globes  intact. Orbital soft tissues within normal limits. Scattered mucosal thickening within the maxillary sinuses bilaterally. Paranasal sinuses are clear. No mastoid effusion. Other: No other significant finding. IMPRESSION: 1. 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 Hemorrhagic transformation with new 6.0 x 4.1 x 4.7 cm intraparenchymal hematoma positioned at the parasagittal anterior/inferior right frontal lobe. Localized shift of the anterior falx up to 12 mm. 2. Right periorbital contusion. Critical Value/emergent results were called by telephone at the time of interpretation on 03/29/2016 at 2:44 am to Dr. Lorella Nimrod , who verbally acknowledged these results. Electronically Signed: By: Jeannine Boga M.D. On: 03/20/2016 02:54   Ct Head Wo Contrast  Addendum Date: 03/15/2016   ADDENDUM REPORT: 03/10/2016 13:20 ADDENDUM: On further review, there is hyperdensity in the right middle cerebral artery with subtle decreased attenuation in the right middle cerebral artery distribution. Dr. Nevada Crane, neuroradiology, discussed the case with the on-call neurologist. Electronically Signed   By: Lowella Grip III M.D.   On: 03/24/2016 13:20   Result Date: 03/30/2016 CLINICAL DATA:  Left-sided weakness EXAM: CT HEAD WITHOUT CONTRAST TECHNIQUE: Contiguous axial images were obtained from the base of the skull through the vertex without intravenous contrast. COMPARISON:  Head CT August 06, 2014 and brain MRI August 07, 2014 FINDINGS: Brain: There is age related volume loss. There is no intracranial mass, hemorrhage,  extra-axial fluid collection, or midline shift. There is evidence of a prior small infarct in the posterior left lentiform nucleus. There is patchy small vessel disease in the centra semiovale bilaterally. No new gray-white compartment lesion is evident. No acute infarct appreciable. Vascular: No hyperdense vessel. There is calcification in each carotid siphon region. There is also calcification in the distal left vertebral artery. Skull: The bony calvarium appears intact. Sinuses/Orbits: There is mucosal thickening in both maxillary antra, considerably more on the left than on the right. There is mucosal thickening in several ethmoid air cells bilaterally. Other paranasal sinuses are clear. No intraorbital lesions are evident. Other: The mastoid air cells are clear. IMPRESSION: Age related volume loss. Patchy periventricular small vessel disease, stable. Prior small lacunar infarct posterior left lentiform nucleus, stable. No intracranial mass, hemorrhage, or extra-axial fluid collection. Several foci of arterial vascular calcification noted. Paranasal sinus disease noted in several areas, most severe in the left maxillary antrum. Electronically Signed: By: Lowella Grip III M.D. On: 03/10/2016 10:37   Ct Angio Neck W Or Wo Contrast  Result Date: 03/14/2016 CLINICAL DATA:  76 year old female found at home by family with left side weakness, facial droop. Initial encounter. EXAM: CT ANGIOGRAPHY HEAD AND NECK CT PERFUSION BRAIN TECHNIQUE: Multidetector CT imaging of the head and neck was performed using the standard protocol during bolus administration of intravenous contrast. Multiplanar CT image reconstructions and MIPs were obtained to evaluate the vascular anatomy. Carotid stenosis measurements (when applicable) are obtained utilizing NASCET criteria, using the distal internal carotid diameter as the denominator. Multiphase CT imaging of the brain was performed following IV bolus contrast injection.  Subsequent parametric perfusion maps were calculated using RAPID software. CONTRAST:  100 mL Isovue 370 COMPARISON:  None. FINDINGS: CT Brain Perfusion Findings: CBF (<30%) Volume: 98 mL Perfusion (Tmax>6.0s) volume: 203 mL Mismatch Volume: 105 mL Infarction Location:  Right MCA territory. CTA NECK Skeleton: Prior median sternotomy. Degenerative changes in the cervical spine. Absent maxillary dentition. No acute osseous abnormality identified. Mild maxillary sinus mucosal thickening. Upper chest: Mild dependent upper lung atelectasis. No superior mediastinal lymphadenopathy. Other neck: Negative thyroid, larynx, pharynx,  parapharyngeal spaces, retropharyngeal space, sublingual space, submandibular glands and parotid glands. No cervical lymphadenopathy. Aortic arch: Bovine type arch configuration with moderate soft and calcified arch atherosclerosis. Right carotid system: No brachiocephalic artery or right CCA origin despite soft and calcified plaque. Somewhat bulky calcified plaque along the proximal anterior right CCA but no associated stenosis. The right carotid bifurcation is patent with soft and calcified plaque but no associated stenosis. There is gradual loss of enhancement of the cervical right ICA distal to the bulb, such that by the skullbase there is virtually no enhancement of the vessel. Left carotid system: Bovine type left CCA origin with soft and calcified plaque but no stenosis. Medial soft and calcified plaque in the left CCA at the level of the thyroid and larynx without stenosis. Mostly calcified plaque at the left carotid bifurcation. No proximal left ICA stenosis. Tortuosity distal to the bulb. Otherwise negative cervical left ICA. Vertebral arteries: Up to 50% proximal right subclavian artery stenosis related to soft and calcified plaque. Calcified plaque at the right vertebral artery origin with only mild stenosis (series 405, image 94). Mild right V2 and V3 segment calcified plaque without  stenosis. Moderate soft and calcified plaque at the left subclavian artery origin. Just distal to the origin there is a left subclavian artery stent but remains patent. The stent terminates just at the left vertebral artery origin which is patent. There is some soft plaque superimposed at the left vert origin, with up to moderate stenosis (series 405, image 97). Calcified and tortuous left V1 segment without hemodynamically significant stenosis. Occasional left V2 and V3 segment calcification without stenosis. CTA HEAD Posterior circulation: Co dominant somewhat dolichoectatic distal vertebral arteries are without stenosis. There is mild left V4 segment calcification. Both PICA origins are patent. Patent vertebrobasilar junction. No basilar stenosis. AICA, SCA, and PCA origins are normal. Posterior communicating arteries are diminutive or absent. Bilateral PCA branches are within normal limits. Anterior circulation: No enhancement of the right ICA siphon, right ICA terminus, or right MCA origin. No right MCA M1 enhancement. There is faint reconstituted flow in a minority of the right MCA branches (series 403, image 53). The right ACA including the a 1 segment is reconstituted probably from the anterior communicating artery. 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 The ACA A2 segment and bilateral ACA branches are within normal limits. The left ICA siphon is patent with mild to moderate calcified plaque. At the anterior genu directed posteriorly there is evidence of a a 3-4 mm partially calcified ICA aneurysm best seen on series 408, image 96 there is no left ICA stenosis up to this point. There is however moderate to severe proximal left supraclinoid stenosis due to calcified plaque (same image). The left ICA terminus is patent. The left MCA and ACA origins are normal. The left A1 segment is normal. Left MCA M1 segment is  mildly irregular but without stenosis. The left MCA bifurcation is patent but with mild to moderate irregularity best seen on series 406, image 21. No left MCA branch occlusion is identified, but there is mild to moderate stenosis of several left M2 origins. Venous sinuses: Appear to be patent. Anatomic variants: Bovine type aortic arch configuration. Review of the MIP images confirms the above findings IMPRESSION: 1. 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 2. Relatively large right MCA territory core infarct, size estimated  at 98 mL. 3. The above was discussed by telephone with Dr. Melba Coon on 04/07/2016 at 1313 hours. 4. 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 5. Superimposed left MCA bifurcation atherosclerosis with mild to moderate left M2 origin stenoses. 6. 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 7. Patent left subclavian artery stent. Up to moderate stenosis of the left vertebral artery origin. 50% stenosis of the right subclavian artery origin due to atherosclerosis. No significant right vertebral artery stenosis. 8. Otherwise negative posterior circulation. Electronically Signed   By: Genevie Ann M.D.   On: 03/17/2016 13:57   Mr Brain Wo Contrast  Result Date: 03/10/2016 CLINICAL DATA:  76 year old female with ELVO - right ICA and right MCA M1. EXAM: MRI HEAD WITHOUT CONTRAST TECHNIQUE: Multiplanar, multiecho pulse sequences of the brain and surrounding structures were obtained without intravenous contrast. COMPARISON:  CTA head and neck and brain CT perfusion 1256 hours today. FINDINGS: The examination had to be discontinued prior to completion due to patient difficulty with cooperation, despite sedation. Axial in coronal diffusion weighted imaging, sagittal T1 weighted imaging, and axial T2 weighted  imaging was obtained. Confluent restricted diffusion in a very similar pattern to the CT perfusion core infarct S2 meant earlier today. Confluent involvement of the right basal ganglia, insula, operculum, and anterior frontal lobe; the posterosuperior left frontal lobe and superior para rolandic cortex are relatively spared. There is also confluent involvement of the right temporal lobe, including the posterior temporal lobe and a portion of the lateral right occipital lobe. No contralateral left hemisphere or posterior fossa diffusion restriction. No midline shift. Little associated T2 hyperintensity in the affected parenchyma at this time. No evidence of associated hemorrhage. No ventriculomegaly. Basilar cisterns are patent. Normal bone marrow signal.  Negative visible cervical spine. IMPRESSION: 1. Relatively large acute right MCA territory infarct with good agreement to the earlier CT perfusion. The posterosuperior right MCA territory is relatively spared. 2. No evidence of associated hemorrhage. No significant intracranial mass effect at this time. 3. The examination had to be discontinued prior to completion despite patient sedation. Electronically Signed   By: Genevie Ann M.D.   On: 03/19/2016 16:17   Ct Cerebral Perfusion W Contrast  Result Date: 03/21/2016 CLINICAL DATA:  76 year old female found at home by family with left side weakness, facial droop. Initial encounter. EXAM: CT ANGIOGRAPHY HEAD AND NECK CT PERFUSION BRAIN TECHNIQUE: Multidetector CT imaging of the head and neck was performed using the standard protocol during bolus administration of intravenous contrast. Multiplanar CT image reconstructions and MIPs were obtained to evaluate the vascular anatomy. Carotid stenosis measurements (when applicable) are obtained utilizing NASCET criteria, using the distal internal carotid diameter as the denominator. Multiphase CT imaging of the brain was performed following IV bolus contrast injection.  Subsequent parametric perfusion maps were calculated using RAPID software. CONTRAST:  100 mL Isovue 370 COMPARISON:  None. FINDINGS: CT Brain Perfusion Findings: CBF (<30%) Volume: 98 mL Perfusion (Tmax>6.0s) volume: 203 mL Mismatch Volume: 105 mL Infarction Location:  Right MCA territory. CTA NECK Skeleton: Prior median sternotomy. Degenerative changes in the cervical spine. Absent maxillary dentition. No acute osseous abnormality identified. Mild maxillary sinus mucosal thickening. Upper chest: Mild dependent upper lung atelectasis. No superior mediastinal lymphadenopathy. Other neck: Negative thyroid, larynx, pharynx, parapharyngeal spaces, retropharyngeal space, sublingual space, submandibular glands and parotid glands. No cervical lymphadenopathy. Aortic arch: Bovine type arch configuration with moderate  soft and calcified arch atherosclerosis. Right carotid system: No brachiocephalic artery or right CCA origin despite soft and calcified plaque. Somewhat bulky calcified plaque along the proximal anterior right CCA but no associated stenosis. The right carotid bifurcation is patent with soft and calcified plaque but no associated stenosis. There is gradual loss of enhancement of the cervical right ICA distal to the bulb, such that by the skullbase there is virtually no enhancement of the vessel. Left carotid system: Bovine type left CCA origin with soft and calcified plaque but no stenosis. Medial soft and calcified plaque in the left CCA at the level of the thyroid and larynx without stenosis. Mostly calcified plaque at the left carotid bifurcation. No proximal left ICA stenosis. Tortuosity distal to the bulb. Otherwise negative cervical left ICA. Vertebral arteries: Up to 50% proximal right subclavian artery stenosis related to soft and calcified plaque. Calcified plaque at the right vertebral artery origin with only mild stenosis (series 405, image 94). Mild right V2 and V3 segment calcified plaque without  stenosis. Moderate soft and calcified plaque at the left subclavian artery origin. Just distal to the origin there is a left subclavian artery stent but remains patent. The stent terminates just at the left vertebral artery origin which is patent. There is some soft plaque superimposed at the left vert origin, with up to moderate stenosis (series 405, image 97). Calcified and tortuous left V1 segment without hemodynamically significant stenosis. Occasional left V2 and V3 segment calcification without stenosis. CTA HEAD Posterior circulation: Co dominant somewhat dolichoectatic distal vertebral arteries are without stenosis. There is mild left V4 segment calcification. Both PICA origins are patent. Patent vertebrobasilar junction. No basilar stenosis. AICA, SCA, and PCA origins are normal. Posterior communicating arteries are diminutive or absent. Bilateral PCA branches are within normal limits. Anterior circulation: No enhancement of the right ICA siphon, right ICA terminus, or right MCA origin. No right MCA M1 enhancement. There is faint reconstituted flow in a minority of the right MCA branches (series 403, image 53). The right ACA including the a 1 segment is reconstituted probably from the anterior communicating artery. 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 The ACA A2 segment and bilateral ACA branches are within normal limits. The left ICA siphon is patent with mild to moderate calcified plaque. At the anterior genu directed posteriorly there is evidence of a a 3-4 mm partially calcified ICA aneurysm best seen on series 408, image 96 there is no left ICA stenosis up to this point. There is however moderate to severe proximal left supraclinoid stenosis due to calcified plaque (same image). The left ICA terminus is patent. The left MCA and ACA origins are normal. The left A1 segment is normal. Left MCA M1 segment is  mildly irregular but without stenosis. The left MCA bifurcation is patent but with mild to moderate irregularity best seen on series 406, image 21. No left MCA branch occlusion is identified, but there is mild to moderate stenosis of several left M2 origins. Venous sinuses: Appear to be patent. Anatomic variants: Bovine type aortic arch configuration. Review of the MIP images confirms the above findings IMPRESSION: 1. 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 2. 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 3. The above was discussed by telephone with Dr. Melba Coon on 04/06/2016 at 1313 hours. 4. Superimposed moderate  to severe supraclinoid left ICA stenosis due to calcified plaque. 5. Superimposed left MCA bifurcation atherosclerosis with mild to moderate left M2 origin stenoses. 6. 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 7. Patent left subclavian artery stent. Up to moderate stenosis of the left vertebral artery origin. 50% stenosis of the right subclavian artery origin due to atherosclerosis. No significant right vertebral artery stenosis. 8. Otherwise negative posterior circulation. Electronically Signed   By: Genevie Ann M.D.   On: 03/22/2016 13:57   Ct Orbits Wo Contrast  Result Date: 03/27/2016 CLINICAL DATA:  Initial evaluation for acute stroke. Right periorbital contusion. EXAM: CT ORBITS WITHOUT CONTRAST TECHNIQUE: Multidetector CT images were obtained using the standard protocol without intravenous contrast. COMPARISON:  None. FINDINGS: Orbits: Globes intact. No retro-orbital hematoma or other pathology. Extraocular muscles normal. Optic nerves within normal limits. Superior orbital veins within normal limits. Lacrimal glands normal. Intraconal and extraconal fat preserved. Visualized sinuses: Mucosal thickening within  the maxillary sinuses, left greater than right. Visualized paranasal sinuses are otherwise clear. No mastoid effusion. Bony orbits intact. Soft tissues: Right periorbital contusion. Visualized soft tissues otherwise unremarkable. Limited intracranial: Evolving right MCA territory infarct, partially visualized. Intraparenchymal hematoma centered at the anterior inferior right frontal lobe, better evaluated on corresponding head CT. IMPRESSION: 1. Large evolving right MCA territory infarct with evidence for hemorrhagic transformation in the anterior right frontal lobe, better evaluated on concomitant CT. 2. Right periorbital contusion. Otherwise unremarkable CT the orbits. Intact globes with no retro-orbital pathology. No fracture. Electronically Signed   By: Jeannine Boga M.D.   On: 03/11/2016 03:07    EKG  normal sinus rhythm. For complete results please see formal report.     HISTORY OF PRESENT ILLNESS This is a 76-yo woman who presented to the ED via EMS for the evaluation of L-sided weakness. Her husband states that at about 730 or 8 this morning, they got up. He was in another room when he heard her call his name. He went to check on her and noticed that she had slurred speech. She wanted to sit up so he went to help her when he noticed that she was weak on her left side. They called 911 and she was brought to the [facility] ED for evaluation. In the ED she was noted to have a left facial droop, a flaccid LUE, and 4/5 strength in the LLE. Her speech was also noted to be garbled. She was sent for [facility] which was initially interpreted as showing no acute abnormality. She was admitted to the medicine service for routine stroke admission and neurology consultation was requested to assist.   I met the patient in the ED. On my exam, she has a dense left hemiparesis with flaccid L arm and relative sparing of the face. She has a L hemianopsia, incomplete L hemineglect, dysarthria, and mild L hemisensory  loss. On my read, CTH shows a hyperdense R MCA sign with early ischemic changes in the R hemisphere including basal ganglia, insula, frontal lobe, and temporal lobe with ASPECTS of 4. She was sent for STAT CTA head/neck and CT perfusion to see if she might be a candidate for intervention. Unfortunately, this showed a large infarct core not amenable to treatment and she has been admitted for routine stroke management.   Last known well: 2130 on 04/02/16 NHISS score: 16 tPA given?: No, patient is outside of the [place].  LEAHNA HEWSON is a 76 y.o. female with history of CVA, CAD, HTN, HLD, and COPD admitted with acute large R MCA CVA, now with hemorrhagic transformation and 12 mm right to left shift. Symptoms left sided weakness.    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 The posterosuperior right MCA territory is relatively spared. No evidence of associated hemorrhage. No significant intracranial mass effect at this time.  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   2D Echo cancelled  LDL 100  HgbA1c 5.5  SCDs for VTE prophylaxis  Diet NPO time specified   aspirin 81 mg daily and clopidogrel 75 mg daily prior to admission, on No antithrombotic after admission  Disposition:  Pending, poor prognosis, family decided comfort care later - pt passed away on 20-Apr-2016 23:14.   Diabetes  HgbA1c 5.5, goal < 7.0  CBG monitoring  Hypertension  Home meds:  Carvedilol 25 mg BID, losartan 100 mg  Given hemorrhagic transformation, started hydralazine 20 mg Q2H prn and nicardipine for SBP goal 150.   Unstable, labile  Hyperlipidemia  Home meds:  Atorvastatin 20 mg QD   Currently on atorvastatin 80 mg QD  LDL 100, goal  < 70  Other Stroke Risk Factors  Advanced age  Tobacco abuse  ETOH use  Hx stroke/TIA  Coronary artery disease   Rosalin Hawking, MD PhD Stroke Neurology 04/07/2016 2:53 PM

## 2016-04-10 NOTE — Consult Note (Signed)
           [facility] Primary Care Navigator  29-Jun-2016  Victoria Mccarty 08/27/1940 [id number]   [place]ent[place] see patient today at the bedside to identify possible discharge needs but staffreports that patient expired last night.  Primary care provider's office (Dr.M. Anderson) called and notified of patient's death.  For questions, please contact:  Wyatt HasteLorraine Grizelda Piscopo, BSN, RN- [facility] Primary [facility] Navigator  Telephone: [phone] [facility]

## 2016-04-10 DEATH — deceased

## 2016-09-16 ENCOUNTER — Other Ambulatory Visit (INDEPENDENT_AMBULATORY_CARE_PROVIDER_SITE_OTHER): Payer: Self-pay

## 2016-09-16 ENCOUNTER — Ambulatory Visit (INDEPENDENT_AMBULATORY_CARE_PROVIDER_SITE_OTHER): Payer: Self-pay | Admitting: Vascular Surgery

## 2016-09-16 ENCOUNTER — Encounter (INDEPENDENT_AMBULATORY_CARE_PROVIDER_SITE_OTHER): Payer: Self-pay

## 2017-02-28 ENCOUNTER — Ambulatory Visit (INDEPENDENT_AMBULATORY_CARE_PROVIDER_SITE_OTHER): Payer: PPO | Admitting: Vascular Surgery

## 2017-02-28 ENCOUNTER — Encounter (INDEPENDENT_AMBULATORY_CARE_PROVIDER_SITE_OTHER): Payer: PPO

## 2017-06-08 IMAGING — CT CT ORBITS W/O CM
3 series · 13 of 47 positions shown, 15 images · non-contrast
Comparison: None.

CLINICAL DATA: Initial evaluation for acute stroke. Right
periorbital contusion.

EXAM:
CT ORBITS WITHOUT CONTRAST
TECHNIQUE: Multidetector CT images were obtained using the standard protocol
without intravenous contrast.

[Series 3: facialbone 2.0 st · axial · 0.34mm/px · z∈[-144,-72]mm · 7 of 45 slices shown, 9 images]
[im 5/45  brain]
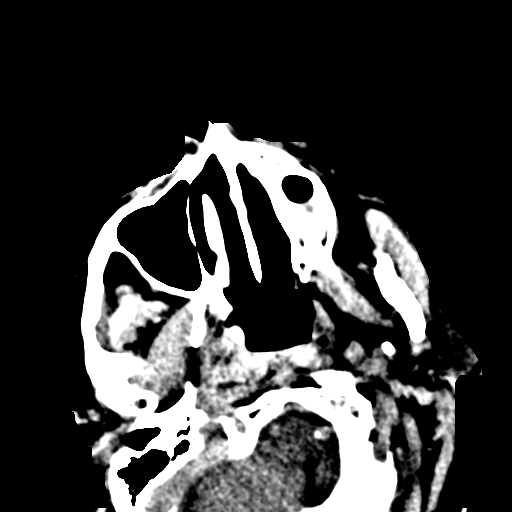
[im 5/45  bone]
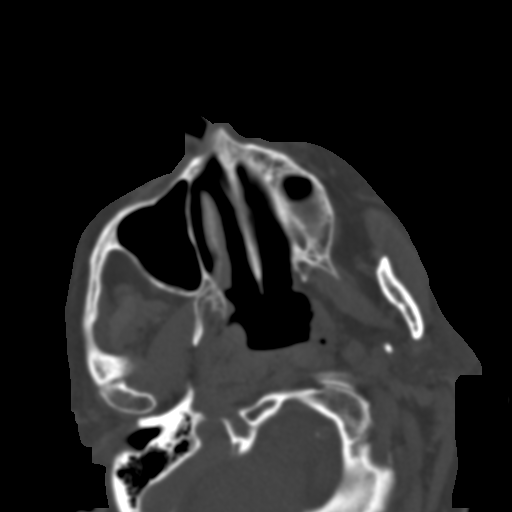
[im 11/45  bone]
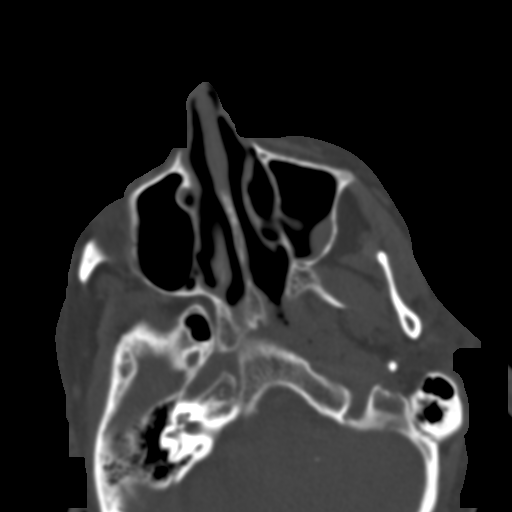
[im 17/45  bone]
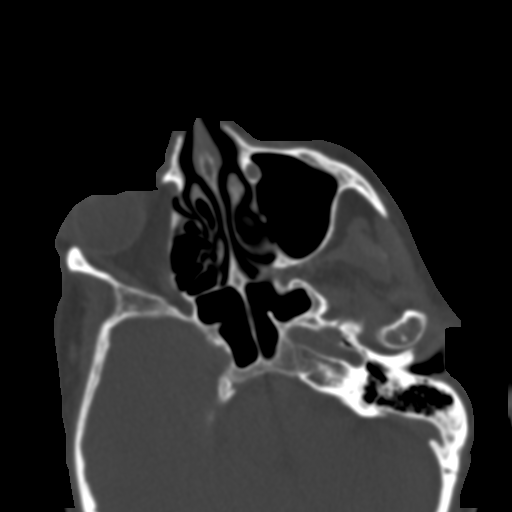
[im 23/45  bone]
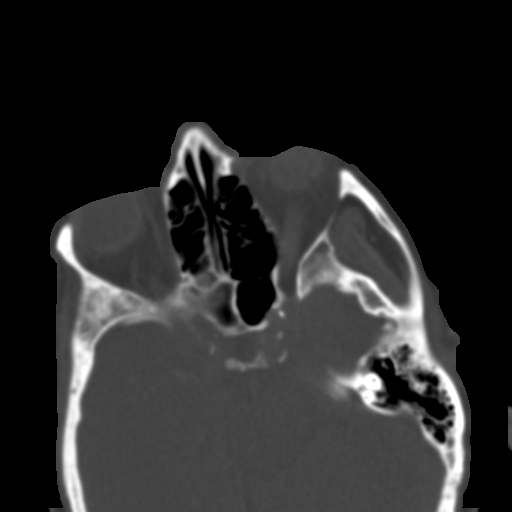
[im 29/45  brain]
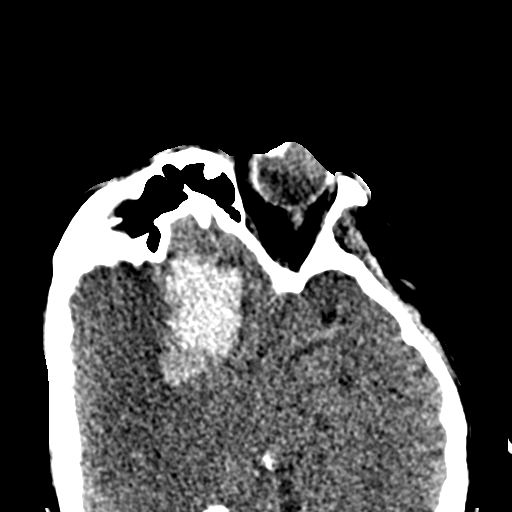
[im 29/45  bone]
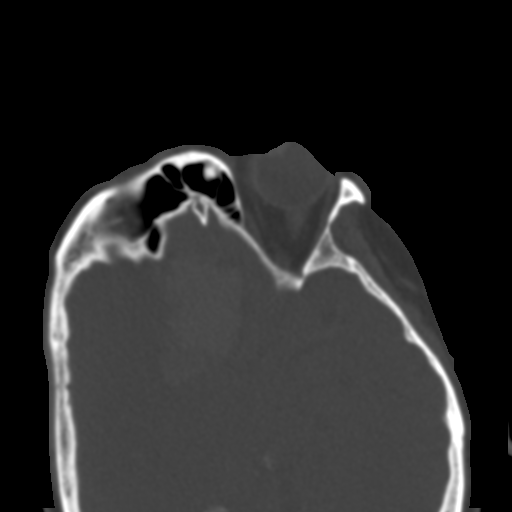
[im 35/45  bone]
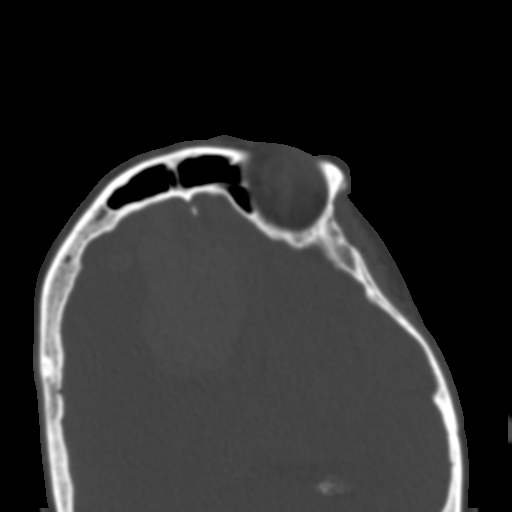
[im 41/45  bone]
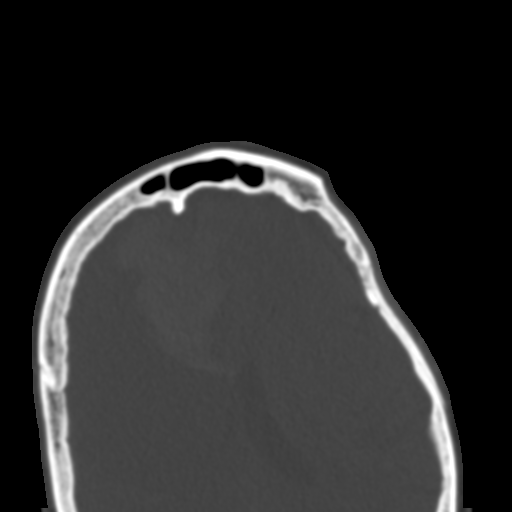

[Series 7: facialbone 2.0 cor st · coronal · 0.21mm/px · 3 of 58 slices shown]
[im 20/58  bone]
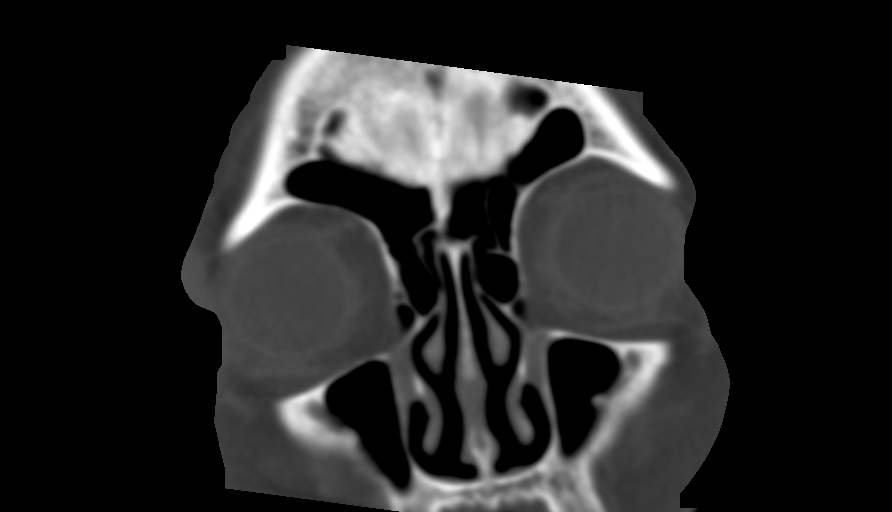
[im 26/58  bone]
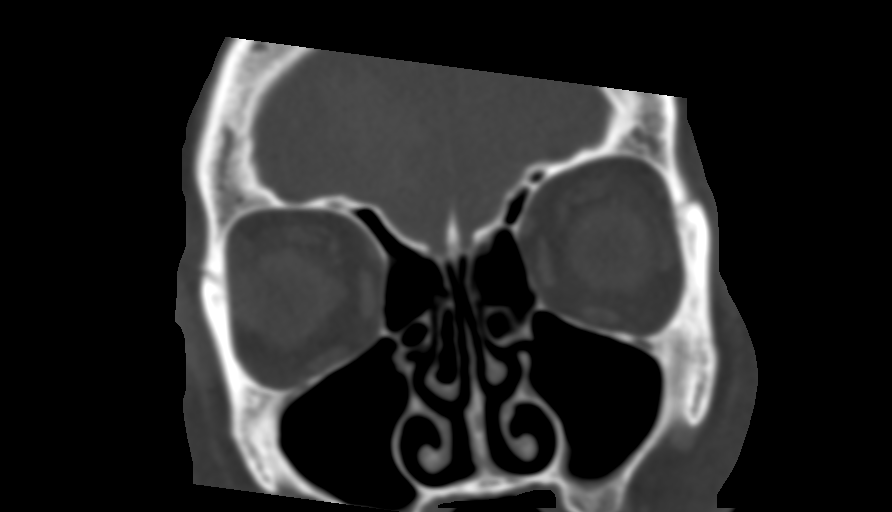
[im 32/58  bone]
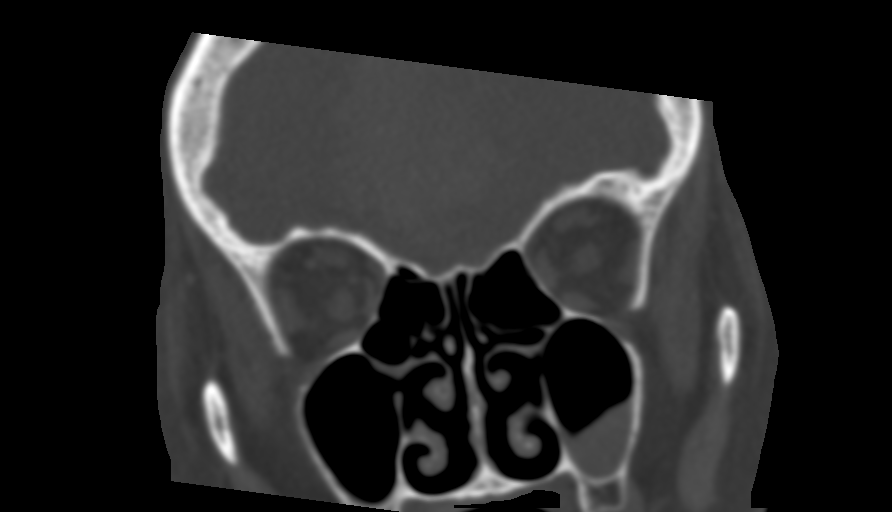

[Series 8: facialbone 2.0 sag st · sagittal · 0.17mm/px · 3 of 85 slices shown]
[im 29/85  bone]
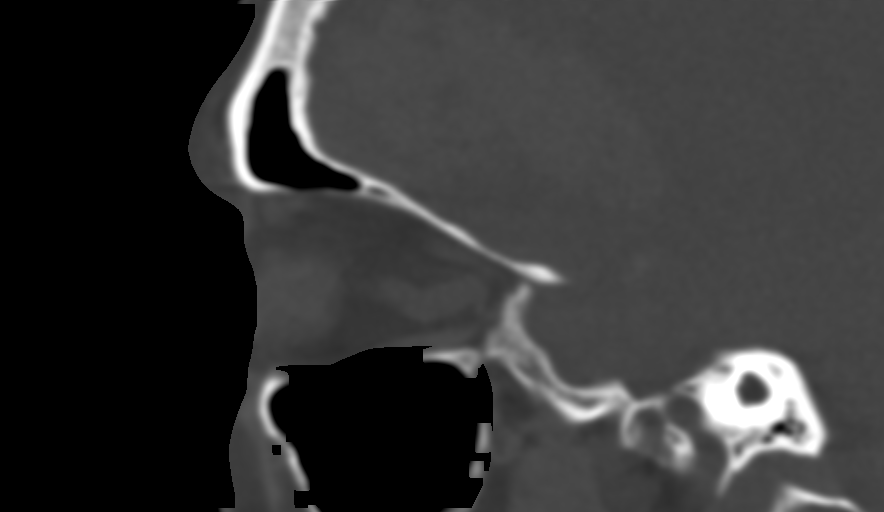
[im 43/85  bone]
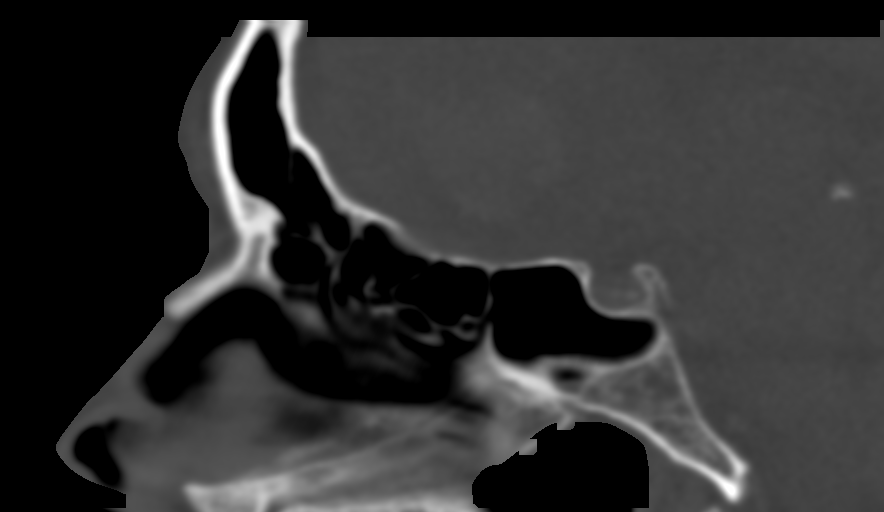
[im 56/85  bone]
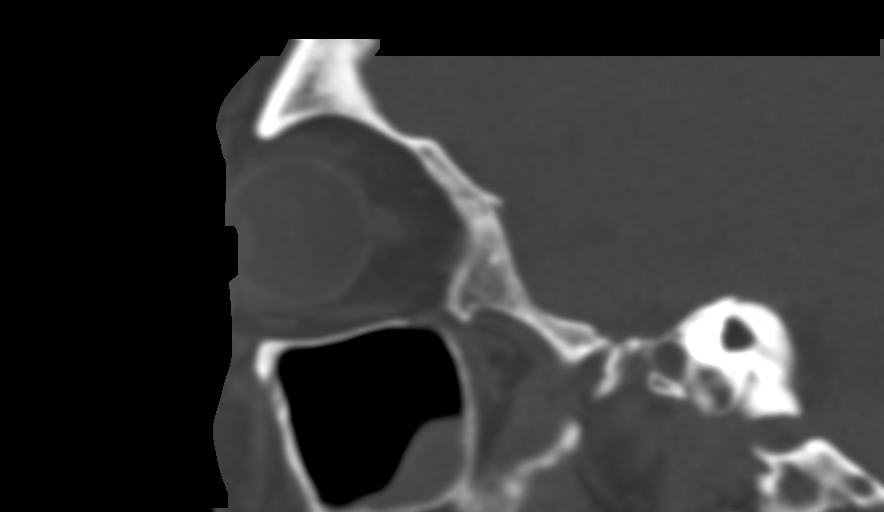

[13 of 47 positions shown; findings below may reference images not displayed]

FINDINGS: Orbits: Globes intact. No retro-orbital hematoma or other pathology.
Extraocular muscles normal. Optic nerves within normal limits.
Superior orbital veins within normal limits. Lacrimal glands normal.
Intraconal and extraconal fat preserved.

Visualized sinuses: Mucosal thickening within the maxillary sinuses,
left greater than right. Visualized paranasal sinuses are otherwise
clear. No mastoid effusion. Bony orbits intact.

Soft tissues: Right periorbital contusion. Visualized soft tissues
otherwise unremarkable.

Limited intracranial: Evolving right MCA territory infarct,
partially visualized. Intraparenchymal hematoma centered at the
anterior inferior right frontal lobe, better evaluated on
corresponding head CT.
IMPRESSION: 1. Large evolving right MCA territory infarct with evidence for
hemorrhagic transformation in the anterior right frontal lobe,
better evaluated on concomitant CT.
2. Right periorbital contusion. Otherwise unremarkable CT the
orbits. Intact globes with no retro-orbital pathology. No fracture.
